# Patient Record
Sex: Male | Born: 1935 | Race: Black or African American | Hispanic: No | Marital: Married | Smoking: Former smoker
Health system: Southern US, Community
[De-identification: ages and names within clinical notes are randomized; demographics above are authoritative.]

## PROBLEM LIST (undated history)

## (undated) DIAGNOSIS — M109 Gout, unspecified: Secondary | ICD-10-CM

## (undated) DIAGNOSIS — N289 Disorder of kidney and ureter, unspecified: Secondary | ICD-10-CM

## (undated) DIAGNOSIS — D691 Qualitative platelet defects: Secondary | ICD-10-CM

## (undated) DIAGNOSIS — I10 Essential (primary) hypertension: Secondary | ICD-10-CM

## (undated) DIAGNOSIS — Z800 Family history of malignant neoplasm of digestive organs: Secondary | ICD-10-CM

## (undated) DIAGNOSIS — F0390 Unspecified dementia without behavioral disturbance: Secondary | ICD-10-CM

## (undated) DIAGNOSIS — E785 Hyperlipidemia, unspecified: Secondary | ICD-10-CM

## (undated) HISTORY — DX: Disorder of kidney and ureter, unspecified: N28.9

## (undated) HISTORY — DX: Essential (primary) hypertension: I10

## (undated) HISTORY — DX: Gout, unspecified: M10.9

## (undated) HISTORY — DX: Family history of malignant neoplasm of digestive organs: Z80.0

## (undated) HISTORY — DX: Qualitative platelet defects: D69.1

## (undated) HISTORY — DX: Hyperlipidemia, unspecified: E78.5

## (undated) HISTORY — PX: CATARACT EXTRACTION: SUR2

## (undated) HISTORY — DX: Unspecified dementia, unspecified severity, without behavioral disturbance, psychotic disturbance, mood disturbance, and anxiety: F03.90

---

## 1999-09-05 ENCOUNTER — Encounter: Payer: Self-pay | Admitting: Emergency Medicine

## 1999-09-05 ENCOUNTER — Emergency Department (HOSPITAL_COMMUNITY): Admission: EM | Admit: 1999-09-05 | Discharge: 1999-09-05 | Payer: Self-pay | Admitting: Emergency Medicine

## 2000-03-13 ENCOUNTER — Emergency Department (HOSPITAL_COMMUNITY): Admission: EM | Admit: 2000-03-13 | Discharge: 2000-03-13 | Payer: Self-pay | Admitting: Emergency Medicine

## 2002-12-20 ENCOUNTER — Encounter: Admission: RE | Admit: 2002-12-20 | Discharge: 2002-12-20 | Payer: Self-pay | Admitting: Family Medicine

## 2004-04-11 ENCOUNTER — Ambulatory Visit: Payer: Self-pay | Admitting: Gastroenterology

## 2004-05-02 ENCOUNTER — Ambulatory Visit: Payer: Self-pay | Admitting: Gastroenterology

## 2006-06-17 ENCOUNTER — Encounter: Admission: RE | Admit: 2006-06-17 | Discharge: 2006-06-17 | Payer: Self-pay | Admitting: Family Medicine

## 2006-06-29 ENCOUNTER — Ambulatory Visit (HOSPITAL_COMMUNITY): Admission: RE | Admit: 2006-06-29 | Discharge: 2006-06-29 | Payer: Self-pay | Admitting: Family Medicine

## 2009-04-12 ENCOUNTER — Encounter (INDEPENDENT_AMBULATORY_CARE_PROVIDER_SITE_OTHER): Payer: Self-pay | Admitting: *Deleted

## 2009-12-04 ENCOUNTER — Telehealth: Payer: Self-pay | Admitting: Gastroenterology

## 2010-03-26 NOTE — Progress Notes (Signed)
Summary: Schedule Office Visit to Discuss Colonoscopy  Phone Note Outgoing Call Call back at [facility] Phone [phone]   Call placed by: Harlow Mares CMA Duncan Dull),  December 04, 2009 4:16 PM Call placed to: Patient Summary of Call: called patient to advise him that he is due for an office visit to discuss colonoscopy but someone at ths number listed above hung up when I asked for the patient. Initial call taken by: Harlow Mares CMA Duncan Dull),  December 04, 2009 4:17 PM

## 2010-03-26 NOTE — Letter (Signed)
Summary: Colonoscopy-Changed to Office Visit Letter  [facility]  [address] [place], [place] [address]   Phone: [phone]  Fax: [phone]      April 12, 2009 MRN: [id number]   Mathew Waller [address] [place], [place]  [address]   Dear Mr. LUU,   According to our records, it is time for you to schedule a Colonoscopy. However, after reviewing your medical record, I feel that an office visit would be most appropriate to more completely evaluate you and determine your need for a repeat procedure.  Please call [phone] (option #2) at your convenience to schedule an office visit. If you have any questions, concerns, or feel that this letter is in error, we would appreciate your call.   Sincerely,  Barbette Hair. Arlyce Dice, M.D.  [facility] [phone]

## 2011-05-26 ENCOUNTER — Encounter: Payer: Self-pay | Admitting: Gastroenterology

## 2011-11-06 ENCOUNTER — Telehealth: Payer: Self-pay | Admitting: Oncology

## 2011-11-06 NOTE — Telephone Encounter (Signed)
S/W pt in re NP appt 10/8 @ 2:30 w/Dr. Arline Asp Referring Dr. Angelita Ingles. [place] Dx- Thrombocytosis NP packet mailed.

## 2011-11-07 ENCOUNTER — Telehealth: Payer: Self-pay | Admitting: Oncology

## 2011-11-07 NOTE — Telephone Encounter (Signed)
C/D on 9/13 for appt 10/08

## 2011-11-18 ENCOUNTER — Other Ambulatory Visit: Payer: Self-pay | Admitting: Diagnostic Neuroimaging

## 2011-11-18 DIAGNOSIS — R413 Other amnesia: Secondary | ICD-10-CM

## 2011-11-19 ENCOUNTER — Telehealth: Payer: Self-pay | Admitting: Gastroenterology

## 2011-11-19 ENCOUNTER — Encounter: Payer: Self-pay | Admitting: Gastroenterology

## 2011-11-19 NOTE — Telephone Encounter (Signed)
Offered an appt with [facility] for this week but [name] requested Dr. Arlyce Dice. Pts appt rescheduled to 12/10/11 @10 :15am. [name] to fax records and notify pt of appt date and time.

## 2011-11-25 ENCOUNTER — Inpatient Hospital Stay: Admission: RE | Admit: 2011-11-25 | Payer: Self-pay | Source: Ambulatory Visit

## 2011-12-01 ENCOUNTER — Other Ambulatory Visit: Payer: Self-pay | Admitting: Oncology

## 2011-12-01 DIAGNOSIS — D75839 Thrombocytosis, unspecified: Secondary | ICD-10-CM | POA: Insufficient documentation

## 2011-12-01 DIAGNOSIS — D473 Essential (hemorrhagic) thrombocythemia: Secondary | ICD-10-CM

## 2011-12-02 ENCOUNTER — Ambulatory Visit: Payer: Self-pay | Admitting: Oncology

## 2011-12-02 ENCOUNTER — Other Ambulatory Visit: Payer: Self-pay | Admitting: Lab

## 2011-12-02 ENCOUNTER — Ambulatory Visit: Payer: Self-pay

## 2011-12-05 ENCOUNTER — Ambulatory Visit
Admission: RE | Admit: 2011-12-05 | Discharge: 2011-12-05 | Disposition: A | Discharge status: Home / Self Care | Payer: Medicare Other | Source: Ambulatory Visit | Attending: Diagnostic Neuroimaging | Admitting: Diagnostic Neuroimaging

## 2011-12-05 DIAGNOSIS — R413 Other amnesia: Secondary | ICD-10-CM

## 2011-12-10 ENCOUNTER — Ambulatory Visit: Payer: Self-pay | Admitting: Gastroenterology

## 2011-12-15 ENCOUNTER — Other Ambulatory Visit: Payer: Self-pay | Admitting: Oncology

## 2011-12-16 ENCOUNTER — Ambulatory Visit: Payer: Medicare Other

## 2011-12-16 ENCOUNTER — Other Ambulatory Visit (HOSPITAL_BASED_OUTPATIENT_CLINIC_OR_DEPARTMENT_OTHER): Payer: Medicare Other | Admitting: Lab

## 2011-12-16 ENCOUNTER — Telehealth: Payer: Self-pay | Admitting: Oncology

## 2011-12-16 ENCOUNTER — Encounter: Payer: Self-pay | Admitting: Oncology

## 2011-12-16 ENCOUNTER — Ambulatory Visit (HOSPITAL_BASED_OUTPATIENT_CLINIC_OR_DEPARTMENT_OTHER): Payer: Medicare Other | Admitting: Oncology

## 2011-12-16 VITALS — BP 194/73 | HR 56 | Temp 97.9°F | Resp 18 | Ht 68.0 in | Wt 147.8 lb

## 2011-12-16 DIAGNOSIS — D75839 Thrombocytosis, unspecified: Secondary | ICD-10-CM

## 2011-12-16 DIAGNOSIS — D473 Essential (hemorrhagic) thrombocythemia: Secondary | ICD-10-CM

## 2011-12-16 LAB — CHCC SMEAR

## 2011-12-16 LAB — CBC WITH DIFFERENTIAL/PLATELET
Basophils Absolute: 0 10*3/uL (ref 0.0–0.1)
Eosinophils Absolute: 0.1 10*3/uL (ref 0.0–0.5)
HGB: 12.4 g/dL — ABNORMAL LOW (ref 13.0–17.1)
MONO#: 0.6 10*3/uL (ref 0.1–0.9)
NEUT#: 3.3 10*3/uL (ref 1.5–6.5)
RBC: 4.6 10*6/uL (ref 4.20–5.82)
RDW: 14.5 % (ref 11.0–14.6)
WBC: 6.9 10*3/uL (ref 4.0–10.3)
lymph#: 2.9 10*3/uL (ref 0.9–3.3)

## 2011-12-16 LAB — MORPHOLOGY

## 2011-12-16 LAB — COMPREHENSIVE METABOLIC PANEL (CC13)
ALT: 15 U/L (ref 0–55)
Albumin: 3.7 g/dL (ref 3.5–5.0)
CO2: 27 mEq/L (ref 22–29)
Glucose: 100 mg/dl — ABNORMAL HIGH (ref 70–99)
Potassium: 4.3 mEq/L (ref 3.5–5.1)
Sodium: 141 mEq/L (ref 136–145)
Total Protein: 7.5 g/dL (ref 6.4–8.3)

## 2011-12-16 LAB — LACTATE DEHYDROGENASE (CC13): LDH: 172 U/L (ref 125–220)

## 2011-12-16 NOTE — Patient Instructions (Signed)
Your platelet count today was normal--333,000.  We will check your blood counts in 2 months and again in 4 months.  If the platelet count is abnormal, we will schedule you for a doctor's appointment, otherwise you don't need to see [place] again.

## 2011-12-16 NOTE — Progress Notes (Signed)
Office new patient consult note dictated.  #[id number]

## 2011-12-16 NOTE — Telephone Encounter (Signed)
gv relative appt schedule for December 2013 and February 2014. Per 10/22 pof lb 2 and 4 mos - no f/u appt.

## 2011-12-16 NOTE — Progress Notes (Signed)
Checked in new pt with no financial concerns. °

## 2011-12-17 NOTE — Progress Notes (Signed)
CC:   Clyda Greener, MD  PROBLEM LIST: 1. History of thrombocytosis with a platelet count of 666,000 on     11/04/2011.  Repeat platelet count on 11/16/2011 was 333,000. 2. Chronic renal disease, stage 3. 3. Hypertension. 4. Dyslipidemia. 5. Suspected early dementia. 6. History of weight loss, possibly secondary to poor dentition.  The     patient apparently weighed 175 pounds back in 2012. 7. Poor dentition. 8. History of gout. 9. Bilateral cataract surgery. 10.Family history of colon cancer.  A son died of metastatic colon     cancer at age 12 and a daughter died at age 56.  MEDICATIONS: 1. Norvasc 10 mg daily. 2. Aspirin 325 mg daily. 3. Atenolol 50 mg daily. 4. Lipitor 20 mg daily. 5. Aricept 5 mg daily. 6. Lasix 20 mg daily. 7. Toprol-XL 50 mg daily.  SMOKING HISTORY:  The patient smoked a few cigarettes a day for about 30 years starting at age 85.  He stopped smoking in the early 1980s.  REASON FOR CONSULTATION:  Thrombocytosis.  HISTORY:  Mr. Javarri Densford is an 78 year old married black male who was seen today on referral from Dr. Clyda Greener for evaluation of thrombocytosis.  A platelet count on 11/04/2011 was 666,000.  The rest of the differential was normal.  We have a platelet count from September 12, 2010, that was 403,000.  The patient is accompanied by his daughter Victorino Dike.  The patient lives with several of his children, specifically his daughter Victorino Dike and 2 sons and possibly some great-grandchildren. The patient is without any complaints today.  He has been in unusually good health.  He states that he has never been hospitalized or had any surgery other then bilateral cataract surgery.  He does have some medical problems as noted above currently under treatment and fairly good control.  He does complain of some arthritis in his feet and history of some weight loss.  Apparently, he has lost approximately 25- 30 pounds over the past year or so, primarily because  of difficulty eating and some pain eating with extremely poor dentition.  He is without complaints today, says he feels fine.  Apparently, the finding of an elevated platelet count was unexpected back a few weeks ago.  PAST MEDICAL HISTORY:  Can be found above.  The patient may have some early dementia.  FAMILY HISTORY:  Notable for colon cancer with a son dying of metastatic colon cancer at age 55 and a daughter dying at age 70.  Other members of the family have had polyps.  The patient apparently has never had a colonoscopy.  Apparently, he is due to see Dr. Melvia Heaps on Thursday for evaluation of his weight loss and possible colonoscopy, although the patient denies any blood in his stools.  There is a family history of diabetes mellitus and hypertension.  SOCIAL HISTORY:  The patient has smoked a few cigarettes a day for 30 years.  He started at age 38.  He stopped smoking in the early 1980s. Apparently, he has been a heavy user of alcohol in the form of hard liquor, although he is not drinking at the present time according to his daughter.  He has a 6th grade education.  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  He is retired, although he cannot tell me for how long.  He does drive, but was advised not to  He drives short distances.  The patient is not a very good historian.  He lives with his wife and children and grandchildren.  REVIEW OF SYSTEMS:  The patient denies any sense of ill health.  He denies ever having a stroke or loss of consciousness.  Vision is good. He has some decrease in hearing.  No history of hay fever or allergies. He denies any respiratory or cardiac problems.  He denies any difficulty with eating, change of bowel habit, blood in the stools, any history of liver problems, jaundice, hepatitis.  He has nocturia 3-4 times which is a recent symptom.  He denies any swelling of the legs,  history of blood clots.  No bleeding or bruising problems.  He has arthritis in his feet. He denies any back pain.  No history of fever, infections, night sweats. He denies any history of skin problems or rashes and denies any history of depression.  PHYSICAL EXAM:  General:  Mr. Roye looks fairly well developed and well nourished.  He does not look cachectic or ill.  His weight today is 147.8 pounds, height 5 feet 8 inches, body surface area 1.79 sq m. Vital Signs:  Blood pressure today was 194/73.  The patient and his daughter Victorino Dike were made aware of his elevated blood pressure.  Other vital signs are normal.  Pulse was regular at 56.  HEENT:  He has male pattern balding.  He has had cataract surgery.  Extraocular movements are normal.  There is conjunctival injection and muddy sclerae. Dentition is quite poor.  He has some torus nodules involving the floor of his mouth.  These look benign.  Neck:  Without adenopathy, thyroid enlargement, or bruit.  No axillary or inguinal adenopathy.  Heart and Lungs:  Normal.  Abdomen:  Benign, soft, nontender with no organomegaly or masses palpable.  A very soft liver edge could be felt descending below the right costal margin with inspiration.  Liver was soft and nontender.  Extremities:  No peripheral edema or clubbing, palmar erythema, petechiae, or purpura.  Neurologic:  Grossly normal.  LABORATORY DATA:  Today, white count 6.9, ANC 3.3, hemoglobin 12.4, hematocrit 38.4, platelets 333,000.  Reds cell indices were normal. Differential was normal.  Inspection of the peripheral smear was unremarkable.  Chemistries were notable for a BUN of 20.0, creatinine 1.4, otherwise normal.  Albumin was 3.7, LDH 172, and glucose was 100.  IMPRESSION AND PLAN:  Today's platelet count is normal at 333,000 with an unremarkable differential.  I cannot explain the elevated platelet count from 11/04/2011 other than this may have been a lab error or some sort  of an acute secondary response that was transient.  In any event, I am doubtful that there is any real hematologic pathology here.  We will plan to check CBC again in 2 months and again in 4 months.  I have not made a return appointment for Mr. Skoda, but would be happy to see him again if any questions arise or certainly if future CBCs come back abnormal.  It is my understanding that Mr. Poth will be seeing Dr. Arlyce Dice in the next couple of days, later this week.  Although the family history for colon cancer is quite striking, I have some reservations about recommending colonoscopy for Mr. Otero in the absence of any findings that would point toward a colon cancer, especially as his age of 70.   ______________________________ Samul Dada, M.D. DSM/MEDQ  D:  12/16/2011  T:  12/17/2011  Job:  [id number]

## 2011-12-19 ENCOUNTER — Ambulatory Visit: Payer: Self-pay | Admitting: Gastroenterology

## 2012-01-15 ENCOUNTER — Other Ambulatory Visit: Payer: Self-pay | Admitting: *Deleted

## 2012-01-20 ENCOUNTER — Ambulatory Visit (INDEPENDENT_AMBULATORY_CARE_PROVIDER_SITE_OTHER): Payer: Medicare Other | Admitting: Gastroenterology

## 2012-01-20 ENCOUNTER — Encounter: Payer: Self-pay | Admitting: Gastroenterology

## 2012-01-20 VITALS — BP 168/70 | HR 58 | Ht 68.0 in | Wt 148.0 lb

## 2012-01-20 DIAGNOSIS — R634 Abnormal weight loss: Secondary | ICD-10-CM

## 2012-01-20 NOTE — Progress Notes (Signed)
History of Present Illness: 78 year old African American male referred at the request of Dr. Bruna Potter for evaluation of weight loss. Over the past 8-10 months he's lost about 8 pounds. His main complaint is feeding difficulties 2 to poor dentition. He has been advised to have his teeth replaced but he has so far resisted. Appetite is good. He denies early satiety, abdominal pain or melena his. Last colonoscopy in 2006 was negative for polyps. Family history is pertinent for 2 children who had colon cancer.    Past Medical History  Diagnosis Date  . Thrombocyte disorder   . Renal disease     stage 3  . Unspecified essential hypertension   . Dyslipidemia   . Dementia   . Gout   . Family history of colon cancer     son deseased with colon cancer   Past Surgical History  Procedure Date  . Cataract extraction    family history includes Colon cancer in his daughter and son; Diabetes in an unspecified family member; and Hypertension in an unspecified family member. Current Outpatient Prescriptions  Medication Sig Dispense Refill  . amLODipine (NORVASC) 10 MG tablet Take 10 mg by mouth daily.      Marland Kitchen aspirin 325 MG tablet Take 325 mg by mouth daily.      Marland Kitchen atenolol (TENORMIN) 50 MG tablet Take 50 mg by mouth daily.      Marland Kitchen atorvastatin (LIPITOR) 20 MG tablet Take 1 tablet by mouth Daily.      Marland Kitchen donepezil (ARICEPT) 5 MG tablet Take 1 tablet by mouth Daily.      . furosemide (LASIX) 20 MG tablet Take 20 mg by mouth daily.      Marland Kitchen lisinopril (PRINIVIL,ZESTRIL) 20 MG tablet Take 20 mg by mouth daily.      . metoprolol succinate (TOPROL-XL) 50 MG 24 hr tablet Take 1 tablet by mouth Daily.       Allergies as of 01/20/2012  . (No Known Allergies)    reports that he quit smoking about 35 years ago. His smoking use included Cigarettes. He has a 9.5 pack-year smoking history. He has never used smokeless tobacco. He reports that he does not drink alcohol or use illicit drugs.     Review of Systems:  Pertinent positive and negative review of systems were noted in the above HPI section. All other review of systems were otherwise negative.  Vital signs were reviewed in today's medical record Physical Exam: General: Well developed , well nourished, no acute distress Head: Normocephalic and atraumatic Eyes:  sclerae anicteric, EOMI Ears: Normal auditory acuity Mouth: No deformity or lesions Neck: Supple, no masses or thyromegaly Lungs: Clear throughout to auscultation Heart: Regular rate and rhythm; no murmurs, rubs or bruits Abdomen: Soft, non tender and non distended. No masses, hepatosplenomegaly or hernias noted. Normal Bowel sounds Rectal:deferred Musculoskeletal: Symmetrical with no gross deformities  Skin: No lesions on visible extremities Pulses:  Normal pulses noted Extremities: No clubbing, cyanosis, edema or deformities noted Neurological: Alert oriented x 4, grossly nonfocal Cervical Nodes:  No significant cervical adenopathy Inguinal Nodes: No significant inguinal adenopathy Psychological:  Alert and cooperative. Normal mood and affect

## 2012-01-20 NOTE — Assessment & Plan Note (Addendum)
According to the patient and his daughter weight loss was due to  eating difficulties secondary to poor dentition. Weight has stabilized and he altogether is feeling well.  Recent lab work was unremarkable.  Recommendations #1 hold further workup provided that weight continues to increase or  stabilizes. Patient was instructed to contact me if he suffers further weight loss or if  any specific concerns arise.

## 2012-01-20 NOTE — Patient Instructions (Addendum)
Follow up as needed

## 2012-02-12 ENCOUNTER — Other Ambulatory Visit (HOSPITAL_BASED_OUTPATIENT_CLINIC_OR_DEPARTMENT_OTHER): Payer: Medicare Other | Admitting: Lab

## 2012-02-12 DIAGNOSIS — D473 Essential (hemorrhagic) thrombocythemia: Secondary | ICD-10-CM

## 2012-02-12 DIAGNOSIS — D75839 Thrombocytosis, unspecified: Secondary | ICD-10-CM

## 2012-02-12 LAB — CBC WITH DIFFERENTIAL/PLATELET
Basophils Absolute: 0.1 10*3/uL (ref 0.0–0.1)
Eosinophils Absolute: 0.1 10*3/uL (ref 0.0–0.5)
HGB: 11.4 g/dL — ABNORMAL LOW (ref 13.0–17.1)
MCV: 84.3 fL (ref 79.3–98.0)
MONO#: 0.6 10*3/uL (ref 0.1–0.9)
MONO%: 6.7 % (ref 0.0–14.0)
NEUT#: 4.4 10*3/uL (ref 1.5–6.5)
Platelets: 308 10*3/uL (ref 140–400)
RBC: 4.16 10*6/uL — ABNORMAL LOW (ref 4.20–5.82)
RDW: 15.7 % — ABNORMAL HIGH (ref 11.0–14.6)
WBC: 8.6 10*3/uL (ref 4.0–10.3)

## 2012-04-15 ENCOUNTER — Other Ambulatory Visit: Payer: Medicare Other | Admitting: Lab

## 2012-04-15 ENCOUNTER — Telehealth: Payer: Self-pay | Admitting: Oncology

## 2012-04-15 NOTE — Telephone Encounter (Signed)
pt called to r/s lab....done °

## 2012-04-16 ENCOUNTER — Other Ambulatory Visit: Payer: Self-pay | Admitting: Oncology

## 2012-04-16 ENCOUNTER — Other Ambulatory Visit (HOSPITAL_BASED_OUTPATIENT_CLINIC_OR_DEPARTMENT_OTHER): Payer: Medicare Other

## 2012-04-16 ENCOUNTER — Encounter: Payer: Self-pay | Admitting: Oncology

## 2012-04-16 DIAGNOSIS — D473 Essential (hemorrhagic) thrombocythemia: Secondary | ICD-10-CM

## 2012-04-16 DIAGNOSIS — D75839 Thrombocytosis, unspecified: Secondary | ICD-10-CM

## 2012-04-16 LAB — CBC WITH DIFFERENTIAL/PLATELET
Basophils Absolute: 0.1 10*3/uL (ref 0.0–0.1)
Eosinophils Absolute: 0.2 10*3/uL (ref 0.0–0.5)
HCT: 35 % — ABNORMAL LOW (ref 38.4–49.9)
LYMPH%: 37.1 % (ref 14.0–49.0)
MCV: 84.1 fL (ref 79.3–98.0)
MONO#: 0.6 10*3/uL (ref 0.1–0.9)
MONO%: 6.8 % (ref 0.0–14.0)
NEUT#: 4.6 10*3/uL (ref 1.5–6.5)
NEUT%: 53.3 % (ref 39.0–75.0)
Platelets: 571 10*3/uL — ABNORMAL HIGH (ref 140–400)
WBC: 8.7 10*3/uL (ref 4.0–10.3)

## 2012-04-16 NOTE — Progress Notes (Unsigned)
This patient had been seen by me on 12/16/2011. He was referred because of a history of thrombocytosis. We did not schedule him for a return appointment. We've been checking his CBC every 2 months.  Platelet count on 12/16/2011 was 333,000. On 02/12/2012, platelet count was 308,000. Today, 04/16/2012, platelet count is 571,000.  We will check another CBC in 1 month and also check for the JAK2 mutation at that time.

## 2012-04-18 ENCOUNTER — Telehealth: Payer: Self-pay | Admitting: Oncology

## 2012-04-18 NOTE — Telephone Encounter (Signed)
S/w pt re appt for 3/21.  °

## 2012-04-19 ENCOUNTER — Other Ambulatory Visit: Payer: Self-pay | Admitting: Medical Oncology

## 2012-05-14 ENCOUNTER — Other Ambulatory Visit: Payer: Medicare Other

## 2015-02-12 DIAGNOSIS — N183 Chronic kidney disease, stage 3 (moderate): Secondary | ICD-10-CM | POA: Diagnosis not present

## 2015-02-12 DIAGNOSIS — I10 Essential (primary) hypertension: Secondary | ICD-10-CM | POA: Diagnosis not present

## 2015-05-10 DIAGNOSIS — Z23 Encounter for immunization: Secondary | ICD-10-CM | POA: Diagnosis not present

## 2015-05-10 DIAGNOSIS — H6121 Impacted cerumen, right ear: Secondary | ICD-10-CM | POA: Diagnosis not present

## 2015-05-10 DIAGNOSIS — I10 Essential (primary) hypertension: Secondary | ICD-10-CM | POA: Diagnosis not present

## 2015-05-10 DIAGNOSIS — H9190 Unspecified hearing loss, unspecified ear: Secondary | ICD-10-CM | POA: Diagnosis not present

## 2015-05-16 DIAGNOSIS — N183 Chronic kidney disease, stage 3 (moderate): Secondary | ICD-10-CM | POA: Diagnosis not present

## 2015-05-16 DIAGNOSIS — I10 Essential (primary) hypertension: Secondary | ICD-10-CM | POA: Diagnosis not present

## 2016-03-10 DIAGNOSIS — I10 Essential (primary) hypertension: Secondary | ICD-10-CM | POA: Diagnosis not present

## 2016-06-09 DIAGNOSIS — Z23 Encounter for immunization: Secondary | ICD-10-CM | POA: Diagnosis not present

## 2016-06-09 DIAGNOSIS — I10 Essential (primary) hypertension: Secondary | ICD-10-CM | POA: Diagnosis not present

## 2016-12-10 DIAGNOSIS — Z23 Encounter for immunization: Secondary | ICD-10-CM | POA: Diagnosis not present

## 2016-12-10 DIAGNOSIS — I10 Essential (primary) hypertension: Secondary | ICD-10-CM | POA: Diagnosis not present

## 2017-07-25 ENCOUNTER — Inpatient Hospital Stay (HOSPITAL_COMMUNITY): Payer: Medicare HMO

## 2017-07-25 ENCOUNTER — Emergency Department (HOSPITAL_COMMUNITY): Payer: Medicare HMO

## 2017-07-25 ENCOUNTER — Inpatient Hospital Stay (HOSPITAL_COMMUNITY)
Admission: EM | Admit: 2017-07-25 | Discharge: 2017-07-29 | DRG: 444 | Disposition: A | Discharge status: Other Acute Inpatient Hospital | Payer: Medicare HMO | Attending: Internal Medicine | Admitting: Internal Medicine

## 2017-07-25 ENCOUNTER — Other Ambulatory Visit: Payer: Self-pay

## 2017-07-25 ENCOUNTER — Encounter (HOSPITAL_COMMUNITY): Payer: Self-pay | Admitting: Internal Medicine

## 2017-07-25 DIAGNOSIS — I160 Hypertensive urgency: Secondary | ICD-10-CM | POA: Diagnosis present

## 2017-07-25 DIAGNOSIS — R740 Nonspecific elevation of levels of transaminase and lactic acid dehydrogenase [LDH]: Secondary | ICD-10-CM | POA: Diagnosis not present

## 2017-07-25 DIAGNOSIS — Z87891 Personal history of nicotine dependence: Secondary | ICD-10-CM

## 2017-07-25 DIAGNOSIS — K8020 Calculus of gallbladder without cholecystitis without obstruction: Secondary | ICD-10-CM | POA: Diagnosis present

## 2017-07-25 DIAGNOSIS — N3289 Other specified disorders of bladder: Secondary | ICD-10-CM | POA: Diagnosis present

## 2017-07-25 DIAGNOSIS — K315 Obstruction of duodenum: Secondary | ICD-10-CM | POA: Diagnosis present

## 2017-07-25 DIAGNOSIS — Z91128 Patient's intentional underdosing of medication regimen for other reason: Secondary | ICD-10-CM

## 2017-07-25 DIAGNOSIS — E876 Hypokalemia: Secondary | ICD-10-CM | POA: Diagnosis not present

## 2017-07-25 DIAGNOSIS — N183 Chronic kidney disease, stage 3 (moderate): Secondary | ICD-10-CM | POA: Diagnosis present

## 2017-07-25 DIAGNOSIS — R945 Abnormal results of liver function studies: Secondary | ICD-10-CM | POA: Diagnosis not present

## 2017-07-25 DIAGNOSIS — N179 Acute kidney failure, unspecified: Secondary | ICD-10-CM | POA: Diagnosis present

## 2017-07-25 DIAGNOSIS — R112 Nausea with vomiting, unspecified: Secondary | ICD-10-CM | POA: Diagnosis not present

## 2017-07-25 DIAGNOSIS — M79641 Pain in right hand: Secondary | ICD-10-CM | POA: Diagnosis present

## 2017-07-25 DIAGNOSIS — E870 Hyperosmolality and hypernatremia: Secondary | ICD-10-CM | POA: Diagnosis present

## 2017-07-25 DIAGNOSIS — Z86010 Personal history of colonic polyps: Secondary | ICD-10-CM | POA: Diagnosis not present

## 2017-07-25 DIAGNOSIS — Z800 Family history of malignant neoplasm of digestive organs: Secondary | ICD-10-CM

## 2017-07-25 DIAGNOSIS — T50996A Underdosing of other drugs, medicaments and biological substances, initial encounter: Secondary | ICD-10-CM | POA: Diagnosis present

## 2017-07-25 DIAGNOSIS — Z681 Body mass index (BMI) 19 or less, adult: Secondary | ICD-10-CM

## 2017-07-25 DIAGNOSIS — D72829 Elevated white blood cell count, unspecified: Secondary | ICD-10-CM | POA: Diagnosis not present

## 2017-07-25 DIAGNOSIS — R64 Cachexia: Secondary | ICD-10-CM | POA: Diagnosis present

## 2017-07-25 DIAGNOSIS — E872 Acidosis: Secondary | ICD-10-CM | POA: Diagnosis present

## 2017-07-25 DIAGNOSIS — K8070 Calculus of gallbladder and bile duct without cholecystitis without obstruction: Principal | ICD-10-CM | POA: Diagnosis present

## 2017-07-25 DIAGNOSIS — R7401 Elevation of levels of liver transaminase levels: Secondary | ICD-10-CM | POA: Insufficient documentation

## 2017-07-25 DIAGNOSIS — I10 Essential (primary) hypertension: Secondary | ICD-10-CM

## 2017-07-25 DIAGNOSIS — D473 Essential (hemorrhagic) thrombocythemia: Secondary | ICD-10-CM | POA: Diagnosis present

## 2017-07-25 DIAGNOSIS — R7989 Other specified abnormal findings of blood chemistry: Secondary | ICD-10-CM

## 2017-07-25 DIAGNOSIS — K838 Other specified diseases of biliary tract: Secondary | ICD-10-CM | POA: Diagnosis not present

## 2017-07-25 DIAGNOSIS — R748 Abnormal levels of other serum enzymes: Secondary | ICD-10-CM | POA: Diagnosis present

## 2017-07-25 DIAGNOSIS — J209 Acute bronchitis, unspecified: Secondary | ICD-10-CM | POA: Diagnosis present

## 2017-07-25 DIAGNOSIS — K210 Gastro-esophageal reflux disease with esophagitis: Secondary | ICD-10-CM | POA: Diagnosis present

## 2017-07-25 DIAGNOSIS — E43 Unspecified severe protein-calorie malnutrition: Secondary | ICD-10-CM

## 2017-07-25 DIAGNOSIS — Z8719 Personal history of other diseases of the digestive system: Secondary | ICD-10-CM

## 2017-07-25 DIAGNOSIS — R1310 Dysphagia, unspecified: Secondary | ICD-10-CM | POA: Diagnosis present

## 2017-07-25 DIAGNOSIS — F0390 Unspecified dementia without behavioral disturbance: Secondary | ICD-10-CM | POA: Diagnosis present

## 2017-07-25 DIAGNOSIS — K8071 Calculus of gallbladder and bile duct without cholecystitis with obstruction: Secondary | ICD-10-CM | POA: Diagnosis not present

## 2017-07-25 DIAGNOSIS — Z972 Presence of dental prosthetic device (complete) (partial): Secondary | ICD-10-CM

## 2017-07-25 DIAGNOSIS — R1110 Vomiting, unspecified: Secondary | ICD-10-CM | POA: Diagnosis not present

## 2017-07-25 DIAGNOSIS — Z79899 Other long term (current) drug therapy: Secondary | ICD-10-CM

## 2017-07-25 DIAGNOSIS — I129 Hypertensive chronic kidney disease with stage 1 through stage 4 chronic kidney disease, or unspecified chronic kidney disease: Secondary | ICD-10-CM | POA: Diagnosis present

## 2017-07-25 DIAGNOSIS — R338 Other retention of urine: Secondary | ICD-10-CM | POA: Diagnosis present

## 2017-07-25 LAB — CBC WITH DIFFERENTIAL/PLATELET
Basophils Absolute: 0 10*3/uL (ref 0.0–0.1)
Basophils Relative: 0 %
EOS ABS: 0 10*3/uL (ref 0.0–0.7)
EOS PCT: 0 %
HCT: 43 % (ref 39.0–52.0)
HEMOGLOBIN: 14.2 g/dL (ref 13.0–17.0)
LYMPHS ABS: 1 10*3/uL (ref 0.7–4.0)
Lymphocytes Relative: 8 %
MCH: 28.2 pg (ref 26.0–34.0)
MCHC: 33 g/dL (ref 30.0–36.0)
MCV: 85.3 fL (ref 78.0–100.0)
Monocytes Absolute: 1.2 10*3/uL — ABNORMAL HIGH (ref 0.1–1.0)
Monocytes Relative: 10 %
NEUTROS PCT: 82 %
Neutro Abs: 10.7 10*3/uL — ABNORMAL HIGH (ref 1.7–7.7)
Platelets: 854 10*3/uL — ABNORMAL HIGH (ref 150–400)
RBC: 5.04 MIL/uL (ref 4.22–5.81)
RDW: 13.5 % (ref 11.5–15.5)
WBC: 13 10*3/uL — ABNORMAL HIGH (ref 4.0–10.5)

## 2017-07-25 LAB — COMPREHENSIVE METABOLIC PANEL
ALBUMIN: 2.9 g/dL — AB (ref 3.5–5.0)
ALT: 152 U/L — AB (ref 17–63)
AST: 142 U/L — AB (ref 15–41)
Alkaline Phosphatase: 288 U/L — ABNORMAL HIGH (ref 38–126)
Anion gap: 13 (ref 5–15)
BUN: 56 mg/dL — ABNORMAL HIGH (ref 6–20)
CHLORIDE: 107 mmol/L (ref 101–111)
CO2: 26 mmol/L (ref 22–32)
CREATININE: 2.52 mg/dL — AB (ref 0.61–1.24)
Calcium: 10.1 mg/dL (ref 8.9–10.3)
GFR calc Af Amer: 24 mL/min — ABNORMAL LOW (ref >60)
GFR calc non Af Amer: 20 mL/min — ABNORMAL LOW (ref >60)
GLUCOSE: 126 mg/dL — AB (ref 65–99)
Potassium: 4.8 mmol/L (ref 3.5–5.1)
Sodium: 146 mmol/L — ABNORMAL HIGH (ref 135–145)
Total Bilirubin: 3.2 mg/dL — ABNORMAL HIGH (ref 0.3–1.2)
Total Protein: 7.8 g/dL (ref 6.5–8.1)

## 2017-07-25 LAB — URINALYSIS, ROUTINE W REFLEX MICROSCOPIC
Glucose, UA: NEGATIVE mg/dL
Ketones, ur: 5 mg/dL — AB
Leukocytes, UA: NEGATIVE
Nitrite: POSITIVE — AB
PROTEIN: 100 mg/dL — AB
SPECIFIC GRAVITY, URINE: 1.021 (ref 1.005–1.030)
pH: 5 (ref 5.0–8.0)

## 2017-07-25 MED ORDER — ONDANSETRON HCL 4 MG/2ML IJ SOLN
4.0000 mg | Freq: Four times a day (QID) | INTRAMUSCULAR | Status: DC | PRN
  Administered 2017-07-25 – 2017-07-26 (×3): 4 mg via INTRAVENOUS
  Filled 2017-07-25 (×3): qty 2

## 2017-07-25 MED ORDER — SODIUM CHLORIDE 0.9 % IV SOLN
INTRAVENOUS | Status: AC
  Administered 2017-07-25: 23:00:00 via INTRAVENOUS

## 2017-07-25 MED ORDER — ONDANSETRON HCL 4 MG PO TABS: 4.0000 mg | ORAL_TABLET | Freq: Four times a day (QID) | ORAL | Status: DC | PRN

## 2017-07-25 MED ORDER — SODIUM CHLORIDE 0.9 % IV SOLN
1.0000 g | INTRAVENOUS | Status: DC
  Filled 2017-07-25: qty 10

## 2017-07-25 MED ORDER — METOPROLOL TARTRATE 5 MG/5ML IV SOLN
5.0000 mg | Freq: Three times a day (TID) | INTRAVENOUS | Status: DC
  Administered 2017-07-25 – 2017-07-26 (×2): 5 mg via INTRAVENOUS
  Filled 2017-07-25 (×2): qty 5

## 2017-07-25 MED ORDER — ACETAMINOPHEN 650 MG RE SUPP: 650.0000 mg | Freq: Four times a day (QID) | RECTAL | Status: DC | PRN

## 2017-07-25 MED ORDER — HYDRALAZINE HCL 20 MG/ML IJ SOLN
10.0000 mg | INTRAMUSCULAR | Status: DC | PRN
  Administered 2017-07-26 (×2): 10 mg via INTRAVENOUS
  Filled 2017-07-25 (×2): qty 1

## 2017-07-25 MED ORDER — PIPERACILLIN-TAZOBACTAM IN DEX 2-0.25 GM/50ML IV SOLN
2.2500 g | Freq: Three times a day (TID) | INTRAVENOUS | Status: DC
  Administered 2017-07-25 – 2017-07-29 (×12): 2.25 g via INTRAVENOUS
  Filled 2017-07-25 (×14): qty 50

## 2017-07-25 MED ORDER — HYDRALAZINE HCL 20 MG/ML IJ SOLN
5.0000 mg | INTRAMUSCULAR | Status: DC | PRN
  Administered 2017-07-25: 5 mg via INTRAVENOUS
  Filled 2017-07-25: qty 1

## 2017-07-25 MED ORDER — SODIUM CHLORIDE 0.9 % IV BOLUS
500.0000 mL | Freq: Once | INTRAVENOUS | Status: AC
  Administered 2017-07-25: 500 mL via INTRAVENOUS

## 2017-07-25 MED ORDER — HYDRALAZINE HCL 20 MG/ML IJ SOLN
10.0000 mg | Freq: Once | INTRAMUSCULAR | Status: AC
  Administered 2017-07-25: 10 mg via INTRAVENOUS
  Filled 2017-07-25: qty 1

## 2017-07-25 MED ORDER — SODIUM CHLORIDE 0.9 % IV BOLUS
500.0000 mL | Freq: Once | INTRAVENOUS | Status: AC
Start: 2017-07-25 — End: 2017-07-25
  Administered 2017-07-25: 500 mL via INTRAVENOUS

## 2017-07-25 MED ORDER — ACETAMINOPHEN 325 MG PO TABS: 650.0000 mg | ORAL_TABLET | Freq: Four times a day (QID) | ORAL | Status: DC | PRN

## 2017-07-25 MED ORDER — SODIUM CHLORIDE 0.9 % IV SOLN
INTRAVENOUS | Status: DC
  Administered 2017-07-25: 12:00:00 via INTRAVENOUS

## 2017-07-25 NOTE — ED Provider Notes (Signed)
Patient sent to me by Dr. Eulis Foster pending ultrasound results which are noted.  Patient also has evidence of UTI given Rocephin here.  Also has evidence of acute kidney injury and will admit to the hospitalist service   Lacretia Leigh, MD 07/25/17 2025

## 2017-07-25 NOTE — ED Notes (Signed)
Patient transported to MRI 

## 2017-07-25 NOTE — ED Triage Notes (Signed)
Per EMS, patient comes from home. Lives with his relative. C/o generalized weakness x4 days, decreased appetite and vomiting. Unable to take his BP meds for 3 days. Hypertensive. No fevers, diarrhea, chills. 18g L arm. Chronic arthritic pain in R arm. Son has [place]- lives in [place]. Pt is alert and oriented.

## 2017-07-25 NOTE — ED Provider Notes (Signed)
[facility] DEPT Provider Note   CSN: [id number] Arrival date & time: 07/25/17  1125     History   Chief Complaint No chief complaint on file.   HPI Mathew Waller is a 84 y.o. male.  HPI   He is here for evaluation of generalized weakness for several days.  He has had anorexia, and vomiting.  He is not taking his blood pressure medication.  Patient is here with his son who came from [place] to visit his father.  He arrived yesterday and noticed that his father had pain in his right wrist that made it difficult to move his right hand.  There is been no known trauma.  The patient has ongoing vomiting, for several days, preventing him from tolerating liquid and solid foods, or his medications.  There is been no diarrhea.  According to the son, the patient's mental status is at baseline.  The son with the patient is his healthcare power of attorney.  There have been no other recent illnesses known to the son.  He did see his PCP recently, but the son was not informed about the hyponatremia that was found on that visit.  It is unclear if there was intervention taken after that diagnosis.  There are no other known modifying factors.  Level 5 caveat-altered mental status  Past Medical History:  Diagnosis Date  . Dementia   . Dyslipidemia   . Family history of colon cancer    son deseased with colon cancer  . Gout   . Renal disease    stage 3  . Thrombocyte disorder   . Unspecified essential hypertension     Patient Active Problem List   Diagnosis Date Noted  . Weight loss 01/20/2012  . Thrombocytosis ([place]) 12/01/2011    Past Surgical History:  Procedure Laterality Date  . CATARACT EXTRACTION          Home Medications    Prior to Admission medications   Medication Sig Start Date End Date Taking? Authorizing Provider  amLODipine (NORVASC) 10 MG tablet Take 10 mg by mouth daily.   Yes [provider]  hydrALAZINE (APRESOLINE) 25 MG  tablet Take 25 mg by mouth 2 (two) times daily. 06/22/17  Yes [provider]  metoprolol succinate (TOPROL-XL) 100 MG 24 hr tablet Take 100 mg by mouth daily. 03/24/16  Yes [provider]  trolamine salicylate (ASPERCREME) 10 % cream Apply 1 application topically as needed for muscle pain.   Yes [provider]    Family History Family History  Problem Relation Age of Onset  . Colon cancer Son        died age 33  . Diabetes Unknown   . Hypertension Unknown   . Colon cancer Daughter     Social History Social History   Tobacco Use  . Smoking status: Former Smoker    Packs/day: 0.25    Years: 38.00    Pack years: 9.50    Types: Cigarettes    Last attempt to quit: 12/15/1976    Years since quitting: 40.6  . Smokeless tobacco: Never Used  Substance Use Topics  . Alcohol use: No  . Drug use: No     Allergies   Patient has no known allergies.   Review of Systems Review of Systems  Unable to perform ROS: Mental status change     Physical Exam Updated Vital Signs BP (S) (!) 210/100 Comment: md aware  Pulse 92   Temp (!) 97.3  F (36.3 C) (Oral)   Resp 20   Ht 5\' 6"  (1.676 m)   Wt 67.1 kg (148 lb)   SpO2 99%   BMI 23.89 kg/m   Physical Exam  Constitutional: He appears well-developed. He appears distressed (He appears somewhat sleepy and uncomfortable).  Elderly, frail.  He is able to follow commands accurately.  HENT:  Head: Normocephalic and atraumatic.  Right Ear: External ear normal.  Left Ear: External ear normal.  Moderately dry oral mucous membranes without other lesions.  Eyes: Pupils are equal, round, and reactive to light. Conjunctivae and EOM are normal. Right eye exhibits no discharge. Left eye exhibits no discharge.  Neck: Normal range of motion and phonation normal. Neck supple.  Cardiovascular: Normal rate, regular rhythm and normal heart sounds.  Pulmonary/Chest: Effort normal and breath sounds normal. He exhibits no  bony tenderness.  Abdominal: Soft. There is no tenderness.  Musculoskeletal: He exhibits no edema.  Normal strength arms and legs bilaterally.  Right hand with decreased motion and pain in second, third and fourth MTP joints.  Neurological: He is alert. No cranial nerve deficit or sensory deficit. He exhibits normal muscle tone. Coordination normal.  Skin: Skin is warm, dry and intact.  Psychiatric: He has a normal mood and affect. His behavior is normal.  Nursing note and vitals reviewed.    ED Treatments / Results  Labs (all labs ordered are listed, but only abnormal results are displayed) Labs Reviewed  COMPREHENSIVE METABOLIC PANEL - Abnormal; Notable for the following components:      Result Value   Sodium 146 (*)    Glucose, Bld 126 (*)    BUN 56 (*)    Creatinine, Ser 2.52 (*)    Albumin 2.9 (*)    AST 142 (*)    ALT 152 (*)    Alkaline Phosphatase 288 (*)    Total Bilirubin 3.2 (*)    GFR calc non Af Amer 20 (*)    GFR calc Af Amer 24 (*)    All other components within normal limits  CBC WITH DIFFERENTIAL/PLATELET - Abnormal; Notable for the following components:   WBC 13.0 (*)    Platelets 854 (*)    Neutro Abs 10.7 (*)    Monocytes Absolute 1.2 (*)    All other components within normal limits  URINALYSIS, ROUTINE W REFLEX MICROSCOPIC    EKG None  Radiology No results found.  Procedures .Critical Care Performed by: Daleen Bo, MD Authorized by: Daleen Bo, MD   Critical care provider statement:    Critical care time (minutes):  35   Critical care start time:  07/25/2017 11:44 AM   Critical care end time:  07/25/2017 3:55 PM   Critical care time was exclusive of:  Separately billable procedures and treating other patients   Critical care was necessary to treat or prevent imminent or life-threatening deterioration of the following conditions:  Dehydration   Critical care was time spent personally by me on the following activities:  Blood draw for  specimens, development of treatment plan with patient or surrogate, discussions with consultants, evaluation of patient's response to treatment, examination of patient, obtaining history from patient or surrogate, ordering and performing treatments and interventions, ordering and review of laboratory studies, pulse oximetry, re-evaluation of patient's condition, review of old charts and ordering and review of radiographic studies   (including critical care time)  Medications Ordered in ED Medications  0.9 %  sodium chloride infusion ( Intravenous New Bag/Given 07/25/17 1205)  hydrALAZINE (APRESOLINE) injection 10 mg (has no administration in time range)  sodium chloride 0.9 % bolus 500 mL (0 mLs Intravenous Stopped 07/25/17 1404)  sodium chloride 0.9 % bolus 500 mL (0 mLs Intravenous Stopped 07/25/17 1514)     Initial Impression / Assessment and Plan / ED Course  I have reviewed the triage vital signs and the nursing notes.  Pertinent labs & imaging results that were available during my care of the patient were reviewed by me and considered in my medical decision making (see chart for details).  Clinical Course as of Jul 26 1555  Sat Jul 25, 2017  1540 Normal except elevated sodium, elevated glucose, elevated BUN, elevated creatinine, low albumin, elevated AST, elevated ALT, elevated alkaline phosphatase, elevated total bilirubin, decreased GFR  Comprehensive metabolic panel(!) [EW]  7902 Normal except white count, and platelets  CBC with Differential(!) [EW]  1540 High  BP(!): 167/126 [EW]  1543 For trending of creatinine and BUN no recent labs but significantly higher than baseline 5 years ago.   [EW]    Clinical Course User Index [EW] Daleen Bo, MD     Patient Vitals for the past 24 hrs:  BP Temp Temp src Pulse Resp SpO2 Height Weight  07/25/17 1544 (!) 210/100 - - 92 20 99 % - -  07/25/17 1543 (!) 236/102 - - 96 19 100 % - -  07/25/17 1351 (!) 167/126 - - 92 13 99 % - -    07/25/17 1140 - - - - - - 5\' 6"  (1.676 m) 67.1 kg (148 lb)  07/25/17 1139 (!) 170/100 (!) 97.3 F (36.3 C) Oral (!) 105 18 100 % - -    3:28 PM Reevaluation with update and discussion. After initial assessment and treatment, an updated evaluation reveals he has been able to drink about 4 ounces of liquid but not eaten anything.  Son (healthcare power of attorney) updated on findings and plan, to obtain ultrasound and admit, he is agreeable.Daleen Bo   Medical Decision Making: Vomiting with abdominal pain, and significant dehydration, AKI,, and hyponatremia.  Consider acute cholecystitis, with elevated WBC, and transaminitis.  Elevated blood pressure related to not be, tolerate oral medications.  Doubt hypertensive urgency.  Patient will require admission.    CRITICAL CARE-yes Performed by: Daleen Bo  Nursing Notes Reviewed/ Care Coordinated Applicable Imaging Reviewed Interpretation of Laboratory Data incorporated into ED treatment   Plan-admit following ultrasound imaging, for further care and management  Final Clinical Impressions(s) / ED Diagnoses   Final diagnoses:  Non-intractable vomiting with nausea, unspecified vomiting type  Transaminitis  AKI (acute kidney injury) ([place])  Hypertension, unspecified type    ED Discharge Orders    None       Daleen Bo, MD 07/25/17 1558

## 2017-07-25 NOTE — ED Notes (Signed)
Patient helped to stand and sit on edge of bed to attempt to urinate. Pt was unsuccessful. Per patients son, he has been having trouble going since a few days ago. Will let MD know.

## 2017-07-25 NOTE — ED Notes (Signed)
MRI tech called to report they were unable to get accurate MRI capture due to pt pulling equipment off and not following instructions. Hx of dementia.

## 2017-07-25 NOTE — ED Notes (Signed)
Water given per patient request. Refusing food at this time. Patient and son aware a urine sample is needed- urinal at bedside.

## 2017-07-25 NOTE — ED Notes (Signed)
ED TO INPATIENT HANDOFF REPORT  Name/Age/Gender Mathew Waller 84 y.o. male  Code Status   Home/SNF/Other Home  Chief Complaint weakness  Level of Care/Admitting Diagnosis ED Disposition    ED Disposition Condition [facility] Area: [place] [[id number]]  Level of Care: Telemetry [5]  Admit to tele based on following criteria: Monitor for Ischemic changes  Diagnosis: ARF (acute renal failure) [facility]) [[id number]]  Admitting Physician: Rise Patience [phone]  Attending Physician: Rise Patience [phone]  Estimated length of stay: past midnight tomorrow  Certification:: I certify this patient will need inpatient services for at least 2 midnights  PT Class (Do Not Modify): Inpatient [101]  PT Acc Code (Do Not Modify): Private [1]       Medical History Past Medical History:  Diagnosis Date  . Dementia   . Dyslipidemia   . Family history of colon cancer    son deseased with colon cancer  . Gout   . Renal disease    stage 3  . Thrombocyte disorder ([place])   . Unspecified essential hypertension     Allergies No Known Allergies  IV Location/Drains/Wounds Patient Lines/Drains/Airways Status   Active Line/Drains/Airways    Name:   Placement date:   Placement time:   Site:   Days:   Peripheral IV 07/25/17 Left Antecubital   07/25/17    1134    Antecubital   less than 1          Labs/Imaging Results for orders placed or performed during the hospital encounter of 07/25/17 (from the past 48 hour(s))  Comprehensive metabolic panel     Status: Abnormal   Collection Time: 07/25/17 12:04 PM  Result Value Ref Range   Sodium 146 (H) 135 - 145 mmol/L   Potassium 4.8 3.5 - 5.1 mmol/L   Chloride 107 101 - 111 mmol/L   CO2 26 22 - 32 mmol/L   Glucose, Bld 126 (H) 65 - 99 mg/dL   BUN 56 (H) 6 - 20 mg/dL   Creatinine, Ser 2.52 (H) 0.61 - 1.24 mg/dL   Calcium 10.1 8.9 - 10.3 mg/dL   Total Protein 7.8 6.5 - 8.1 g/dL   Albumin 2.9 (L) 3.5 -  5.0 g/dL   AST 142 (H) 15 - 41 U/L   ALT 152 (H) 17 - 63 U/L   Alkaline Phosphatase 288 (H) 38 - 126 U/L   Total Bilirubin 3.2 (H) 0.3 - 1.2 mg/dL   GFR calc non Af Amer 20 (L) >60 mL/min   GFR calc Af Amer 24 (L) >60 mL/min    Comment: (NOTE) The eGFR has been calculated using the CKD EPI equation. This calculation has not been validated in all clinical situations. eGFR's persistently <60 mL/min signify possible Chronic Kidney Disease.    Anion gap 13 5 - 15    Comment: Performed at [facility], [place] [address] [place], [place] [address]  CBC with Differential     Status: Abnormal   Collection Time: 07/25/17 12:04 PM  Result Value Ref Range   WBC 13.0 (H) 4.0 - 10.5 K/uL   RBC 5.04 4.22 - 5.81 MIL/uL   Hemoglobin 14.2 13.0 - 17.0 g/dL   HCT 43.0 39.0 - 52.0 %   MCV 85.3 78.0 - 100.0 fL   MCH 28.2 26.0 - 34.0 pg   MCHC 33.0 30.0 - 36.0 g/dL   RDW 13.5 11.5 - 15.5 %   Platelets 854 (H) 150 - 400  K/uL   Neutrophils Relative % 82 %   Neutro Abs 10.7 (H) 1.7 - 7.7 K/uL   Lymphocytes Relative 8 %   Lymphs Abs 1.0 0.7 - 4.0 K/uL   Monocytes Relative 10 %   Monocytes Absolute 1.2 (H) 0.1 - 1.0 K/uL   Eosinophils Relative 0 %   Eosinophils Absolute 0.0 0.0 - 0.7 K/uL   Basophils Relative 0 %   Basophils Absolute 0.0 0.0 - 0.1 K/uL    Comment: Performed at [facility], [place] [address], [place], [place] [address]  Urinalysis, Routine w reflex microscopic     Status: Abnormal   Collection Time: 07/25/17  3:40 PM  Result Value Ref Range   Color, Urine AMBER (A) YELLOW    Comment: BIOCHEMICALS MAY BE AFFECTED BY COLOR   APPearance CLOUDY (A) CLEAR   Specific Gravity, Urine 1.021 1.005 - 1.030   pH 5.0 5.0 - 8.0   Glucose, UA NEGATIVE NEGATIVE mg/dL   Hgb urine dipstick MODERATE (A) NEGATIVE   Bilirubin Urine MODERATE (A) NEGATIVE   Ketones, ur 5 (A) NEGATIVE mg/dL   Protein, ur 100 (A) NEGATIVE mg/dL   Nitrite POSITIVE (A) NEGATIVE    Leukocytes, UA NEGATIVE NEGATIVE   RBC / HPF 6-10 0 - 5 RBC/hpf   WBC, UA 6-10 0 - 5 WBC/hpf   Bacteria, UA FEW (A) NONE SEEN   Squamous Epithelial / LPF 0-5 0 - 5   Mucus PRESENT    Hyaline Casts, UA PRESENT    Amorphous Crystal PRESENT     Comment: Performed at [facility], [place] [address] [place], [place] [address]   US Abdomen Complete  Result Date: 07/25/2017 CLINICAL DATA:  Acute renal failure. Abdominal pain. Vomiting. Anorexia. EXAM: ABDOMEN ULTRASOUND COMPLETE COMPARISON:  None. FINDINGS: Gallbladder: There is a 1.7 cm calcified shadowing gallstone in the contracted gallbladder. Apparent mild diffuse gallbladder wall thickening. No pericholecystic fluid. No sonographic Murphy's sign. Common bile duct: Diameter: 8 mm, mildly dilated. No choledocholithiasis demonstrated, noting nonvisualization of the distal common bile duct due to overlying bowel gas. Liver: Liver parenchyma is mildly heterogeneous, with normal echogenicity. No liver mass. Portal vein is patent on color Doppler imaging with normal direction of blood flow towards the liver. IVC: No abnormality visualized. Pancreas: Visualized portion unremarkable, with top-normal main pancreatic duct. Spleen: Size and appearance within normal limits. Right Kidney: Length: 9.7 cm. Echogenic right renal parenchyma. No right hydronephrosis. No right renal mass. Left Kidney: Length: 10.1 cm. Echogenic left renal parenchyma. No left hydronephrosis. No left renal mass. Abdominal aorta: Atherosclerotic abdominal aorta with ectatic 2.7 cm infrarenal abdominal aorta. Other findings: Small volume upper abdominal ascites. IMPRESSION: 1. Cholelithiasis. Contracted gallbladder. Apparent mild diffuse gallbladder wall thickening. No pericholecystic fluid. No sonographic Murphy's sign. Sonographic findings are most compatible with chronic calculous cholecystitis. 2. Mildly dilated common bile duct (8 mm diameter). No choledocholithiasis  demonstrated, noting nonvisualization of the distal CBD due to overlying bowel gas. Recommend correlation with serum bilirubin levels. Consider MRI abdomen with MRCP without and with IV contrast for further evaluation, as clinically warranted. 3. Nonspecific mild liver parenchymal heterogeneity, cannot exclude hepatocellular disease. No liver mass. 4. Echogenic kidneys, indicative of nonspecific renal parenchymal disease of uncertain chronicity. No hydronephrosis. 5. Ectatic 2.7 cm infrarenal abdominal aorta, at risk for aneurysm development. Recommend follow-up aortic ultrasound in 5 years. This recommendation follows ACR consensus guidelines: White Paper of the ACR Incidental Findings Committee II on Vascular Findings. J Am Coll  Radiol 2013; 11:216-244. Electronically Signed   By: Ilona Sorrel M.D.   On: 07/25/2017 19:45   Dg Hand Complete Right  Result Date: 07/25/2017 CLINICAL DATA:  Hand pain and weakness, no known injury, initial encounter EXAM: RIGHT HAND - COMPLETE 3+ VIEW COMPARISON:  None. FINDINGS: Examination is somewhat limited secondary to patient's inability to straighten is fingers. Mild osteopenia is seen. No definitive acute fracture is seen. Diffuse vascular calcifications are noted. IMPRESSION: No acute abnormality noted. Electronically Signed   By: Inez Catalina M.D.   On: 07/25/2017 16:56    Pending Labs Unresulted Labs (From admission, onward)   None      Vitals/Pain Today's Vitals   07/25/17 1642 07/25/17 1700 07/25/17 1933 07/25/17 1939  BP: (!) 199/93 (!) 186/106 (!) 200/97   Pulse: 90 95 (!) 107   Resp: 19 (!) 21 19   Temp:      TempSrc:      SpO2: 100% 100% 100%   Weight:      Height:      PainSc:    0-No pain    Isolation Precautions No active isolations  Medications Medications  0.9 %  sodium chloride infusion ( Intravenous New Bag/Given 07/25/17 1205)  cefTRIAXone (ROCEPHIN) 1 g in sodium chloride 0.9 % 100 mL IVPB (has no administration in time range)   hydrALAZINE (APRESOLINE) injection 5 mg (has no administration in time range)  sodium chloride 0.9 % bolus 500 mL (0 mLs Intravenous Stopped 07/25/17 1404)  sodium chloride 0.9 % bolus 500 mL (0 mLs Intravenous Stopped 07/25/17 1514)  hydrALAZINE (APRESOLINE) injection 10 mg (10 mg Intravenous Given 07/25/17 1601)    Mobility non-ambulatory (due to weakness)

## 2017-07-25 NOTE — Progress Notes (Signed)
Pharmacy Antibiotic Note  Mathew Waller is a 84 y.o. male admitted on 07/25/2017 with intra-abdominal infection.  Ultrasound shows gallstone.  Pharmacy has been consulted for Zosyn dosing.  Plan: Zosyn 2.25gm IV q8h Follow renal function  Height: 5\' 6"  (167.6 cm) Weight: 148 lb (67.1 kg) IBW/kg (Calculated) : 63.8  Temp (24hrs), Avg:97.3 F (36.3 C), Min:97.3 F (36.3 C), Max:97.3 F (36.3 C)  Recent Labs  Lab 07/25/17 1204  WBC 13.0*  CREATININE 2.52*    Estimated Creatinine Clearance: 16.2 mL/min (A) (by C-G formula based on SCr of 2.52 mg/dL (H)).    No Known Allergies  Antimicrobials this admission: 6/1 Zosyn >>    Dose adjustments this admission:  Microbiology results:  Thank you for allowing pharmacy to be a part of this patient's care.  Mathew Waller, Mathew Waller 07/25/2017 9:07 PM

## 2017-07-25 NOTE — H&P (Signed)
History and Physical    Mathew Waller TKW:[id number] DOB: Aug 03, 1921 DOA: 07/25/2017  PCP: [name], MD (Inactive)  Patient coming from: Home.  History obtained from patient and patient's son.  Chief Complaint: Nausea vomiting.  HPI: Mathew Waller is a 84 y.o. male with history of hypertension presents to the ER with complaints of persistent nausea vomiting over the last 4 days.  Unable to keep in anything.  Denies any abdominal pain chest pain or shortness of breath.  At the same time patient also has noticed to have increased pain in the right hand with difficult to make fist.  Denies any trauma or fall.  Patient was unable to take his medications due to the vomiting.  Denies any diarrhea.  ED Course: In the ER labs revealed increased creatinine from baseline with elevated AST ALT and bilirubin.  X-ray of the right hand did not show anything acute.  UA shows features concerning for UTI.  Sonogram of the abdomen done shows features concerning for chronic cholecystitis with CBD dilatation.  MRCP has been ordered which is pending.  Patient started on empiric antibiotics.  Patient is also found to have markedly elevated blood pressure.  Patient appears nonfocal.  Review of Systems: As per HPI, rest all negative.   Past Medical History:  Diagnosis Date  . Dementia   . Dyslipidemia   . Family history of colon cancer    son deseased with colon cancer  . Gout   . Renal disease    stage 3  . Thrombocyte disorder ([place])   . Unspecified essential hypertension     Past Surgical History:  Procedure Laterality Date  . CATARACT EXTRACTION       reports that he quit smoking about 40 years ago. His smoking use included cigarettes. He has a 9.50 pack-year smoking history. He has never used smokeless tobacco. He reports that he does not drink alcohol or use drugs.  No Known Allergies  Family History  Problem Relation Age of Onset  . Colon cancer Son        died age 73  . Diabetes  Unknown   . Hypertension Unknown   . Colon cancer Daughter     Prior to Admission medications   Medication Sig Start Date End Date Taking? Authorizing Provider  amLODipine (NORVASC) 10 MG tablet Take 10 mg by mouth daily.   Yes [provider]  hydrALAZINE (APRESOLINE) 25 MG tablet Take 25 mg by mouth 2 (two) times daily. 06/22/17  Yes [provider]  metoprolol succinate (TOPROL-XL) 100 MG 24 hr tablet Take 100 mg by mouth daily. 03/24/16  Yes [provider]  trolamine salicylate (ASPERCREME) 10 % cream Apply 1 application topically as needed for muscle pain.   Yes [provider]    Physical Exam: Vitals:   07/25/17 1700 07/25/17 1933 07/25/17 2000 07/25/17 2030  BP: (!) 186/106 (!) 200/97 (!) 170/81 (!) 205/86  Pulse: 95 (!) 107 98 (!) 108  Resp: (!) 21 19 17 19   Temp:      TempSrc:      SpO2: 100% 100% 99% 99%  Weight:      Height:          Constitutional: Moderately built and nourished. Vitals:   07/25/17 1700 07/25/17 1933 07/25/17 2000 07/25/17 2030  BP: (!) 186/106 (!) 200/97 (!) 170/81 (!) 205/86  Pulse: 95 (!) 107 98 (!) 108  Resp: (!) 21 19 17 19   Temp:  TempSrc:      SpO2: 100% 100% 99% 99%  Weight:      Height:       Eyes: Anicteric no pallor. ENMT: No discharge from the ears eyes nose or mouth. Neck: No mass felt.  No neck rigidity.  No JVD appreciated. Respiratory: No rhonchi or crepitations. Cardiovascular: S1-S2 heard no murmurs appreciated. Abdomen: Soft nontender bowel sounds present. Musculoskeletal: Right hand is unable to make a fist due to pain and swelling. Skin: No rash. Neurologic: Alert awake oriented to time place and person.  Moves all extremities. Psychiatric: Appears normal per normal affect.   Labs on Admission: I have personally reviewed following labs and imaging studies  CBC: Recent Labs  Lab 07/25/17 1204  WBC 13.0*  NEUTROABS 10.7*  HGB 14.2  HCT 43.0  MCV 85.3  PLT 854*    Basic Metabolic Panel: Recent Labs  Lab 07/25/17 1204  NA 146*  K 4.8  CL 107  CO2 26  GLUCOSE 126*  BUN 56*  CREATININE 2.52*  CALCIUM 10.1   GFR: Estimated Creatinine Clearance: 16.2 mL/min (A) (by C-G formula based on SCr of 2.52 mg/dL (H)). Liver Function Tests: Recent Labs  Lab 07/25/17 1204  AST 142*  ALT 152*  ALKPHOS 288*  BILITOT 3.2*  PROT 7.8  ALBUMIN 2.9*   No results for input(s): LIPASE, AMYLASE in the last 168 hours. No results for input(s): AMMONIA in the last 168 hours. Coagulation Profile: No results for input(s): INR, PROTIME in the last 168 hours. Cardiac Enzymes: No results for input(s): CKTOTAL, CKMB, CKMBINDEX, TROPONINI in the last 168 hours. BNP (last 3 results) No results for input(s): PROBNP in the last 8760 hours. HbA1C: No results for input(s): HGBA1C in the last 72 hours. CBG: No results for input(s): GLUCAP in the last 168 hours. Lipid Profile: No results for input(s): CHOL, HDL, LDLCALC, TRIG, CHOLHDL, LDLDIRECT in the last 72 hours. Thyroid Function Tests: No results for input(s): TSH, T4TOTAL, FREET4, T3FREE, THYROIDAB in the last 72 hours. Anemia Panel: No results for input(s): VITAMINB12, FOLATE, FERRITIN, TIBC, IRON, RETICCTPCT in the last 72 hours. Urine analysis:    Component Value Date/Time   COLORURINE AMBER (A) 07/25/2017 1540   APPEARANCEUR CLOUDY (A) 07/25/2017 1540   LABSPEC 1.021 07/25/2017 1540   PHURINE 5.0 07/25/2017 1540   GLUCOSEU NEGATIVE 07/25/2017 1540   HGBUR MODERATE (A) 07/25/2017 1540   BILIRUBINUR MODERATE (A) 07/25/2017 1540   KETONESUR 5 (A) 07/25/2017 1540   PROTEINUR 100 (A) 07/25/2017 1540   NITRITE POSITIVE (A) 07/25/2017 1540   LEUKOCYTESUR NEGATIVE 07/25/2017 1540   Sepsis Labs: @LABRCNTIP (procalcitonin:4,lacticidven:4) )No results found for this or any previous visit (from the past 240 hour(s)).   Radiological Exams on Admission: US Abdomen Complete  Result Date:  07/25/2017 CLINICAL DATA:  Acute renal failure. Abdominal pain. Vomiting. Anorexia. EXAM: ABDOMEN ULTRASOUND COMPLETE COMPARISON:  None. FINDINGS: Gallbladder: There is a 1.7 cm calcified shadowing gallstone in the contracted gallbladder. Apparent mild diffuse gallbladder wall thickening. No pericholecystic fluid. No sonographic Murphy's sign. Common bile duct: Diameter: 8 mm, mildly dilated. No choledocholithiasis demonstrated, noting nonvisualization of the distal common bile duct due to overlying bowel gas. Liver: Liver parenchyma is mildly heterogeneous, with normal echogenicity. No liver mass. Portal vein is patent on color Doppler imaging with normal direction of blood flow towards the liver. IVC: No abnormality visualized. Pancreas: Visualized portion unremarkable, with top-normal main pancreatic duct. Spleen: Size and appearance within normal limits. Right Kidney: Length: 9.7 cm.  Echogenic right renal parenchyma. No right hydronephrosis. No right renal mass. Left Kidney: Length: 10.1 cm. Echogenic left renal parenchyma. No left hydronephrosis. No left renal mass. Abdominal aorta: Atherosclerotic abdominal aorta with ectatic 2.7 cm infrarenal abdominal aorta. Other findings: Small volume upper abdominal ascites. IMPRESSION: 1. Cholelithiasis. Contracted gallbladder. Apparent mild diffuse gallbladder wall thickening. No pericholecystic fluid. No sonographic Murphy's sign. Sonographic findings are most compatible with chronic calculous cholecystitis. 2. Mildly dilated common bile duct (8 mm diameter). No choledocholithiasis demonstrated, noting nonvisualization of the distal CBD due to overlying bowel gas. Recommend correlation with serum bilirubin levels. Consider MRI abdomen with MRCP without and with IV contrast for further evaluation, as clinically warranted. 3. Nonspecific mild liver parenchymal heterogeneity, cannot exclude hepatocellular disease. No liver mass. 4. Echogenic kidneys, indicative of  nonspecific renal parenchymal disease of uncertain chronicity. No hydronephrosis. 5. Ectatic 2.7 cm infrarenal abdominal aorta, at risk for aneurysm development. Recommend follow-up aortic ultrasound in 5 years. This recommendation follows ACR consensus guidelines: White Paper of the ACR Incidental Findings Committee II on Vascular Findings. J Am Coll Radiol 2013; 10:789-794. Electronically Signed   By: Ilona Sorrel M.D.   On: 07/25/2017 19:45   Dg Hand Complete Right  Result Date: 07/25/2017 CLINICAL DATA:  Hand pain and weakness, no known injury, initial encounter EXAM: RIGHT HAND - COMPLETE 3+ VIEW COMPARISON:  None. FINDINGS: Examination is somewhat limited secondary to patient's inability to straighten is fingers. Mild osteopenia is seen. No definitive acute fracture is seen. Diffuse vascular calcifications are noted. IMPRESSION: No acute abnormality noted. Electronically Signed   By: Inez Catalina M.D.   On: 07/25/2017 16:56     Assessment/Plan Active Problems:   ARF (acute renal failure) (HCC)   Hypertensive urgency   Nausea & vomiting   Cholelithiasis    1. Persistent nausea vomiting with elevated LFTs and cholelithiasis with features concerning for chronic cholecystitis with CBD dilation -primary concerning for any CBD obstruction with possible cholecystitis.  Abdomen appears benign on exam.  I have placed patient on Zosyn.  I will order MRCP which is pending.  Will need to get surgery and/or GI consult based on MRCP finding. 2. Possible UTI and empiric antibiotics follow cultures. 3. Hypertensive urgency likely secondary to patient not able to take his oral medications.  I have placed patient on PRN IV hydralazine and scheduled metoprolol IV. 4. Right hand pain and swelling likely from gout.  X-rays do not show any fracture.  Will check uric acid level.  No definite signs of infection.  Probably will need steroids. 5. Acute renal failure on chronic kidney disease stage III.  Creatinine  has increased from baseline of around 1.9.  Likely from persistent vomiting.  Gently hydrate and follow metabolic panel.  Follow intake output.  Ultrasound abdomen does not show any definite obstruction.  Does show medical renal disease.  Not sure if patient drinks alcohol every day.  Patient not very forthcoming.    DVT prophylaxis: SCDs in anticipation of possible procedure. Code Status: Full code. Family Communication: Patient's son. Disposition Plan: Home. Consults called: None. Admission status: Inpatient.   Rise Patience MD [facility] Pager [phone].  If 7PM-7AM, please contact night-coverage [email or web address] Password [facility]  07/25/2017, 10:42 PM

## 2017-07-26 ENCOUNTER — Other Ambulatory Visit: Payer: Self-pay

## 2017-07-26 ENCOUNTER — Inpatient Hospital Stay (HOSPITAL_COMMUNITY): Payer: Medicare HMO

## 2017-07-26 LAB — CBC WITH DIFFERENTIAL/PLATELET
BASOS ABS: 0 10*3/uL (ref 0.0–0.1)
BASOS PCT: 0 %
EOS ABS: 0 10*3/uL (ref 0.0–0.7)
Eosinophils Relative: 0 %
HCT: 44.1 % (ref 39.0–52.0)
HEMOGLOBIN: 14.5 g/dL (ref 13.0–17.0)
LYMPHS ABS: 1.9 10*3/uL (ref 0.7–4.0)
Lymphocytes Relative: 12 %
MCH: 27.4 pg (ref 26.0–34.0)
MCHC: 32.9 g/dL (ref 30.0–36.0)
MCV: 83.4 fL (ref 78.0–100.0)
Monocytes Absolute: 0.8 10*3/uL (ref 0.1–1.0)
Monocytes Relative: 5 %
NEUTROS PCT: 83 %
Neutro Abs: 12.9 10*3/uL — ABNORMAL HIGH (ref 1.7–7.7)
Platelets: 762 10*3/uL — ABNORMAL HIGH (ref 150–400)
RBC: 5.29 MIL/uL (ref 4.22–5.81)
RDW: 13.5 % (ref 11.5–15.5)
WBC: 15.6 10*3/uL — AB (ref 4.0–10.5)

## 2017-07-26 LAB — TROPONIN I
TROPONIN I: 0.09 ng/mL — AB (ref <0.03)
TROPONIN I: 0.06 ng/mL — AB (ref <0.03)

## 2017-07-26 LAB — BASIC METABOLIC PANEL
Anion gap: 14 (ref 5–15)
BUN: 55 mg/dL — ABNORMAL HIGH (ref 6–20)
CO2: 20 mmol/L — AB (ref 22–32)
Calcium: 9.5 mg/dL (ref 8.9–10.3)
Chloride: 111 mmol/L (ref 101–111)
Creatinine, Ser: 2.04 mg/dL — ABNORMAL HIGH (ref 0.61–1.24)
GFR calc Af Amer: 30 mL/min — ABNORMAL LOW (ref >60)
GFR, EST NON AFRICAN AMERICAN: 26 mL/min — AB (ref >60)
GLUCOSE: 109 mg/dL — AB (ref 65–99)
POTASSIUM: 4.2 mmol/L (ref 3.5–5.1)
Sodium: 145 mmol/L (ref 135–145)

## 2017-07-26 LAB — HEPATIC FUNCTION PANEL
ALBUMIN: 2.7 g/dL — AB (ref 3.5–5.0)
ALT: 139 U/L — ABNORMAL HIGH (ref 17–63)
AST: 129 U/L — ABNORMAL HIGH (ref 15–41)
Alkaline Phosphatase: 283 U/L — ABNORMAL HIGH (ref 38–126)
BILIRUBIN DIRECT: 1.9 mg/dL — AB (ref 0.1–0.5)
BILIRUBIN TOTAL: 3.1 mg/dL — AB (ref 0.3–1.2)
Indirect Bilirubin: 1.2 mg/dL — ABNORMAL HIGH (ref 0.3–0.9)
Total Protein: 7 g/dL (ref 6.5–8.1)

## 2017-07-26 LAB — GLUCOSE, CAPILLARY
Glucose-Capillary: 102 mg/dL — ABNORMAL HIGH (ref 65–99)
Glucose-Capillary: 107 mg/dL — ABNORMAL HIGH (ref 65–99)
Glucose-Capillary: 110 mg/dL — ABNORMAL HIGH (ref 65–99)
Glucose-Capillary: 111 mg/dL — ABNORMAL HIGH (ref 65–99)
Glucose-Capillary: 99 mg/dL (ref 65–99)

## 2017-07-26 LAB — URIC ACID: URIC ACID, SERUM: 11.4 mg/dL — AB (ref 4.4–7.6)

## 2017-07-26 MED ORDER — METOPROLOL TARTRATE 5 MG/5ML IV SOLN
5.0000 mg | Freq: Four times a day (QID) | INTRAVENOUS | Status: DC
  Administered 2017-07-26 – 2017-07-29 (×12): 5 mg via INTRAVENOUS
  Filled 2017-07-26 (×12): qty 5

## 2017-07-26 MED ORDER — HYDRALAZINE HCL 20 MG/ML IJ SOLN
10.0000 mg | Freq: Four times a day (QID) | INTRAMUSCULAR | Status: DC
  Administered 2017-07-26 – 2017-07-29 (×13): 10 mg via INTRAVENOUS
  Filled 2017-07-26 (×13): qty 1

## 2017-07-26 NOTE — Consult Note (Signed)
Reason for Consult: Gallstones chronic / cholecystitis Referring Physician: Wynelle Cleveland MD   Mathew Waller is an 84 y.o. male.  HPI: Asked to see patient at the request of Dr. Wynelle Cleveland MD for epigastric pain, nausea and vomiting.  The patient has severe dementia and came in with acute renal failure secondary to nausea and vomiting.  He was worked up and found to have a contracted gallbladder with gallstones.  His white count is 15,000 and his liver function studies are elevated with a bilirubin of 3.1.  Currently denies abdominal pain, nausea or vomiting but history is limited due to his dementia.  Ultrasound shows a contracted gallbladder with gallstones.  MRI is pending.  He complains of no pain but again his history is difficult to obtain secondary to severe dementia.  Past Medical History:  Diagnosis Date  . Dementia   . Dyslipidemia   . Family history of colon cancer    son deseased with colon cancer  . Gout   . Renal disease    stage 3  . 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 Unspecified essential hypertension     Past Surgical History:  Procedure Laterality Date  . CATARACT EXTRACTION      Family History  Problem Relation Age of Onset  . Colon cancer Son        died age 37  . Diabetes Unknown   . Hypertension Unknown   . Colon cancer Daughter     Social History:  reports that he quit smoking about 40 years ago. His smoking use included cigarettes. He has a 9.50 pack-year smoking history. He has never used smokeless tobacco. He reports that he does not drink alcohol or use drugs.  Allergies: No Known Allergies  Medications: I have reviewed the patient's current medications.  Results for orders placed or performed during the hospital encounter of 07/25/17 (from the past 48 hour(s))  Comprehensive metabolic panel     Status: Abnormal   Collection Time: 07/25/17 12:04 PM  Result Value Ref Range   Sodium 146 (H) 135 - 145 mmol/L   Potassium 4.8 3.5 - 5.1 mmol/L   Chloride 107 101 - 111  mmol/L   CO2 26 22 - 32 mmol/L   Glucose, Bld 126 (H) 65 - 99 mg/dL   BUN 56 (H) 6 - 20 mg/dL   Creatinine, Ser 2.52 (H) 0.61 - 1.24 mg/dL   Calcium 10.1 8.9 - 10.3 mg/dL   Total Protein 7.8 6.5 - 8.1 g/dL   Albumin 2.9 (L) 3.5 - 5.0 g/dL   AST 142 (H) 15 - 41 U/L   ALT 152 (H) 17 - 63 U/L   Alkaline Phosphatase 288 (H) 38 - 126 U/L   Total Bilirubin 3.2 (H) 0.3 - 1.2 mg/dL   GFR calc non Af Amer 20 (L) >60 mL/min   GFR calc Af Amer 24 (L) >60 mL/min    Comment: (NOTE) The eGFR has been calculated using the CKD EPI equation. This calculation has not been validated in all clinical situations. eGFR's persistently <60 mL/min signify possible Chronic Kidney Disease.    Anion gap 13 5 - 15    Comment: Performed at [facility], [place] [address] [place],  [address]  CBC with Differential     Status: Abnormal   Collection Time: 07/25/17 12:04 PM  Result Value Ref Range   WBC 13.0 (H) 4.0 - 10.5 K/uL   RBC 5.04 4.22 - 5.81 MIL/uL   Hemoglobin 14.2 13.0 - 17.0  g/dL   HCT 43.0 39.0 - 52.0 %   MCV 85.3 78.0 - 100.0 fL   MCH 28.2 26.0 - 34.0 pg   MCHC 33.0 30.0 - 36.0 g/dL   RDW 13.5 11.5 - 15.5 %   Platelets 854 (H) 150 - 400 K/uL   Neutrophils Relative % 82 %   Neutro Abs 10.7 (H) 1.7 - 7.7 K/uL   Lymphocytes Relative 8 %   Lymphs Abs 1.0 0.7 - 4.0 K/uL   Monocytes Relative 10 %   Monocytes Absolute 1.2 (H) 0.1 - 1.0 K/uL   Eosinophils Relative 0 %   Eosinophils Absolute 0.0 0.0 - 0.7 K/uL   Basophils Relative 0 %   Basophils Absolute 0.0 0.0 - 0.1 K/uL    Comment: Performed at [facility], [place] [address] [place], [place] [address]  Urinalysis, Routine w reflex microscopic     Status: Abnormal   Collection Time: 07/25/17  3:40 PM  Result Value Ref Range   Color, Urine AMBER (A) YELLOW    Comment: BIOCHEMICALS MAY BE AFFECTED BY COLOR   APPearance CLOUDY (A) CLEAR   Specific Gravity, Urine 1.021 1.005 - 1.030   pH 5.0 5.0 - 8.0    Glucose, UA NEGATIVE NEGATIVE mg/dL   Hgb urine dipstick MODERATE (A) NEGATIVE   Bilirubin Urine MODERATE (A) NEGATIVE   Ketones, ur 5 (A) NEGATIVE mg/dL   Protein, ur 100 (A) NEGATIVE mg/dL   Nitrite POSITIVE (A) NEGATIVE   Leukocytes, UA NEGATIVE NEGATIVE   RBC / HPF 6-10 0 - 5 RBC/hpf   WBC, UA 6-10 0 - 5 WBC/hpf   Bacteria, UA FEW (A) NONE SEEN   Squamous Epithelial / LPF 0-5 0 - 5   Mucus PRESENT    Hyaline Casts, UA PRESENT    Amorphous Crystal PRESENT     Comment: Performed at [facility], [place] [address] [place], [place] [address]  Uric acid     Status: Abnormal   Collection Time: 07/25/17 11:42 PM  Result Value Ref Range   Uric Acid, Serum 11.4 (H) 4.4 - 7.6 mg/dL    Comment: Performed at [facility], [place] [address], [place], [place] [address]  Troponin I     Status: Abnormal   Collection Time: 07/25/17 11:42 PM  Result Value Ref Range   Troponin I 0.06 (HH) <0.03 ng/mL    Comment: CRITICAL RESULT CALLED TO, READ BACK BY AND VERIFIED WITH: PENG AT 0035 ON 07/26/2017 BY MOSLEY,J Performed at [facility], [place] [address] [place], [place] [address]   Glucose, capillary     Status: Abnormal   Collection Time: 07/26/17 12:19 AM  Result Value Ref Range   Glucose-Capillary 102 (H) 65 - 99 mg/dL  Basic metabolic panel     Status: Abnormal   Collection Time: 07/26/17  4:23 AM  Result Value Ref Range   Sodium 145 135 - 145 mmol/L   Potassium 4.2 3.5 - 5.1 mmol/L   Chloride 111 101 - 111 mmol/L   CO2 20 (L) 22 - 32 mmol/L   Glucose, Bld 109 (H) 65 - 99 mg/dL   BUN 55 (H) 6 - 20 mg/dL   Creatinine, Ser 2.04 (H) 0.61 - 1.24 mg/dL   Calcium 9.5 8.9 - 10.3 mg/dL   GFR calc non Af Amer 26 (L) >60 mL/min   GFR calc Af Amer 30 (L) >60 mL/min    Comment: (NOTE) The eGFR has been calculated using  the CKD EPI equation. This calculation has not been validated in all clinical situations. eGFR's persistently <60 mL/min signify  possible Chronic Kidney Disease.    Anion gap 14 5 - 15    Comment: Performed at [facility], [place] [address] [place], [place] [address]  Hepatic function panel     Status: Abnormal   Collection Time: 07/26/17  4:23 AM  Result Value Ref Range   Total Protein 7.0 6.5 - 8.1 g/dL   Albumin 2.7 (L) 3.5 - 5.0 g/dL   AST 129 (H) 15 - 41 U/L   ALT 139 (H) 17 - 63 U/L   Alkaline Phosphatase 283 (H) 38 - 126 U/L   Total Bilirubin 3.1 (H) 0.3 - 1.2 mg/dL   Bilirubin, Direct 1.9 (H) 0.1 - 0.5 mg/dL   Indirect Bilirubin 1.2 (H) 0.3 - 0.9 mg/dL    Comment: Performed at [facility], [place] [address], [place], [place] [address]  CBC WITH DIFFERENTIAL     Status: Abnormal   Collection Time: 07/26/17  4:23 AM  Result Value Ref Range   WBC 15.6 (H) 4.0 - 10.5 K/uL   RBC 5.29 4.22 - 5.81 MIL/uL   Hemoglobin 14.5 13.0 - 17.0 g/dL   HCT 44.1 39.0 - 52.0 %   MCV 83.4 78.0 - 100.0 fL   MCH 27.4 26.0 - 34.0 pg   MCHC 32.9 30.0 - 36.0 g/dL   RDW 13.5 11.5 - 15.5 %   Platelets 762 (H) 150 - 400 K/uL   Neutrophils Relative % 83 %   Neutro Abs 12.9 (H) 1.7 - 7.7 K/uL   Lymphocytes Relative 12 %   Lymphs Abs 1.9 0.7 - 4.0 K/uL   Monocytes Relative 5 %   Monocytes Absolute 0.8 0.1 - 1.0 K/uL   Eosinophils Relative 0 %   Eosinophils Absolute 0.0 0.0 - 0.7 K/uL   Basophils Relative 0 %   Basophils Absolute 0.0 0.0 - 0.1 K/uL    Comment: Performed at [facility], [place] [address], [place], [place] [address]  Troponin I (q 6hr x 3)     Status: Abnormal   Collection Time: 07/26/17  6:30 AM  Result Value Ref Range   Troponin I 0.09 (HH) <0.03 ng/mL    Comment: CRITICAL VALUE NOTED.  VALUE IS CONSISTENT WITH PREVIOUSLY REPORTED AND CALLED VALUE. Performed at [facility], [place] [address] [place], [place] [address]   Glucose, capillary     Status: None   Collection Time: 07/26/17  6:42 AM  Result Value Ref Range    Glucose-Capillary 99 65 - 99 mg/dL    US Abdomen Complete  Result Date: 07/25/2017 CLINICAL DATA:  Acute renal failure. Abdominal pain. Vomiting. Anorexia. EXAM: ABDOMEN ULTRASOUND COMPLETE COMPARISON:  None. FINDINGS: Gallbladder: There is a 1.7 cm calcified shadowing gallstone in the contracted gallbladder. Apparent mild diffuse gallbladder wall thickening. No pericholecystic fluid. No sonographic Murphy's sign. Common bile duct: Diameter: 8 mm, mildly dilated. No choledocholithiasis demonstrated, noting nonvisualization of the distal common bile duct due to overlying bowel gas. Liver: Liver parenchyma is mildly heterogeneous, with normal echogenicity. No liver mass. Portal vein is patent on color Doppler imaging with normal direction of blood flow towards the liver. IVC: No abnormality visualized. Pancreas: Visualized portion unremarkable, with top-normal main pancreatic duct. Spleen: Size and appearance within normal limits. Right Kidney: Length: 9.7 cm. Echogenic right renal parenchyma. No right hydronephrosis. No right renal mass. Left Kidney: Length: 10.1  cm. Echogenic left renal parenchyma. No left hydronephrosis. No left renal mass. Abdominal aorta: Atherosclerotic abdominal aorta with ectatic 2.7 cm infrarenal abdominal aorta. Other findings: Small volume upper abdominal ascites. IMPRESSION: 1. Cholelithiasis. Contracted gallbladder. Apparent mild diffuse gallbladder wall thickening. No pericholecystic fluid. No sonographic Murphy's sign. Sonographic findings are most compatible with chronic calculous cholecystitis. 2. Mildly dilated common bile duct (8 mm diameter). No choledocholithiasis demonstrated, noting nonvisualization of the distal CBD due to overlying bowel gas. Recommend correlation with serum bilirubin levels. Consider MRI abdomen with MRCP without and with IV contrast for further evaluation, as clinically warranted. 3. Nonspecific mild liver parenchymal heterogeneity, cannot exclude  hepatocellular disease. No liver mass. 4. Echogenic kidneys, indicative of nonspecific renal parenchymal disease of uncertain chronicity. No hydronephrosis. 5. Ectatic 2.7 cm infrarenal abdominal aorta, at risk for aneurysm development. Recommend follow-up aortic ultrasound in 5 years. This recommendation follows ACR consensus guidelines: White Paper of the ACR Incidental Findings Committee II on Vascular Findings. J Am Coll Radiol 2013; 10:789-794. Electronically Signed   By: Ilona Sorrel M.D.   On: 07/25/2017 19:45   Dg Chest Port 1 View  Result Date: 07/26/2017 CLINICAL DATA:  Nausea and vomiting last night. EXAM: PORTABLE CHEST 1 VIEW COMPARISON:  Chest x-ray dated 06/17/2006. FINDINGS: Study is slightly hypoinspiratory with crowding of the bibasilar bronchovascular markings. Given the slightly low lung volumes, lungs are clear. No confluent opacity to suggest a developing pneumonia. No pleural effusion or pneumothorax seen. Heart size and mediastinal contours are stable. Atherosclerotic changes noted at the aortic arch. No acute or suspicious osseous finding. IMPRESSION: 1.  No active disease.  No evidence of pneumonia or pulmonary edema. 2.   Aortic atherosclerosis. Electronically Signed   By: Franki Cabot M.D.   On: 07/26/2017 07:19   Dg Hand Complete Right  Result Date: 07/25/2017 CLINICAL DATA:  Hand pain and weakness, no known injury, initial encounter EXAM: RIGHT HAND - COMPLETE 3+ VIEW COMPARISON:  None. FINDINGS: Examination is somewhat limited secondary to patient's inability to straighten is fingers. Mild osteopenia is seen. No definitive acute fracture is seen. Diffuse vascular calcifications are noted. IMPRESSION: No acute abnormality noted. Electronically Signed   By: Inez Catalina M.D.   On: 07/25/2017 16:56    Review of Systems  Unable to perform ROS: Age   Blood pressure (!) 148/72, pulse 95, temperature 98.1 F (36.7 C), temperature source Oral, resp. rate 20, height '5\' 6"'$  (1.676  m), weight 55.4 kg (122 lb 2.2 oz), SpO2 100 %. Physical Exam  Constitutional: He appears lethargic. He appears cachectic. He has a sickly appearance.  HENT:  Head: Normocephalic and atraumatic.  Eyes: Pupils are equal, round, and reactive to light. No scleral icterus.  Neck: Normal range of motion. Neck supple.  Cardiovascular: Normal rate and regular rhythm.  Respiratory: Effort normal and breath sounds normal.  GI: Soft. He exhibits no distension and no mass. There is no tenderness. There is no rebound and no guarding.  Neurological: He appears lethargic. He is disoriented. GCS eye subscore is 4. GCS verbal subscore is 5. GCS motor subscore is 6.  Skin: Skin is warm and dry.  Psychiatric: He has a normal mood and affect.    Assessment/Plan: Gallstones with signs of chronic cholecystitis without acute exacerbation  Elevated liver function studies  Advanced age  Dementia  Elevated troponin  Agree with MRCP once done.  Patient does not appear to be a good operative candidate and has no signs of acute cholecystitis.  He could have retained common bile duct stones leading to potential ascending cholangitis which could worsen his mental status and elevated his white count.  GI consultation if MRCP positive for common bile duct stones.  Cholecystectomy possible but given advanced age in no acute signs may be best to clear duct if that is an issue and follow.  No family at bedside to discuss.  Will follow along.  Merla Sawka A D[place]ik [place] 07/26/2017, 8:38 AM

## 2017-07-26 NOTE — Progress Notes (Signed)
PROGRESS NOTE    Mathew Waller   RDE:[id number]  DOB: [birth year]  DOA: 07/25/2017 PCP: [name], MD (Inactive)   Brief Narrative:  Mathew Waller  a 84 y.o. male with history of dementia and hypertension presents to the ER with complaints of persistent nausea vomiting over the last 4 days. History obtained from son in ED.   He was uable to take his medications due to the vomiting and also had increased pain in the right hand with difficult to make fist  LFTS noted to be elevated, Ultrasound revealed gallstones and CBD dilated to 8 mm. BP was as high as 205/86   Subjective: Does not appear to be in any pain. Confused.     Assessment & Plan:   Principal Problem:   Nausea & vomiting with elevated LFTs and CBD dilatation and  Cholelithiasis Leukocytosis - awaiting MRCP - he does not seem to have pain, vomiting or fevers in hospital-  - on Zosyn - start clear liquids and follow for symptoms  Active Problems:   ARF (acute renal failure) vs CKD - likely prerenal- last Cr in 2013 was 1.4- has been hydrated - Cr 2.52>> 2.04    Hypertensive urgency - due to inability to take Norvasc, Hydralazine and Toprol - currently on Hydralazine 10 mg QID, Lopressor 5mg  Q8hr- change Lopressor to Q6 hrs - BP improved  Elevated troponin - due to HTN-   Right hand pain? - I have palpated his hand today and he has not pain- no swelling either - Uric acid is 11.4 - xray done in ED unrevealing   DVT prophylaxis: SCDs Code Status: Full code Family Communication:  Disposition Plan: follow LFTs and symptoms Consultants:   gen surgery Procedures:    Antimicrobials:  Anti-infectives (From admission, onward)   Start     Dose/Rate Route Frequency Ordered Stop   07/25/17 2200  piperacillin-tazobactam (ZOSYN) IVPB 2.25 g     2.25 g 100 mL/hr over 30 Minutes Intravenous Every 8 hours 07/25/17 2107     07/25/17 2030  cefTRIAXone (ROCEPHIN) 1 g in sodium chloride 0.9 % 100 mL IVPB   Status:  Discontinued     1 g 200 mL/hr over 30 Minutes Intravenous Every 24 hours 07/25/17 2024 07/25/17 2045       Objective: Vitals:   07/26/17 0101 07/26/17 0501 07/26/17 0814 07/26/17 1252  BP: 133/64 (!) 198/86 (!) 148/72 (!) 172/92  Pulse: 98 95    Resp:  20    Temp:  98.1 F (36.7 C)    TempSrc:  Oral    SpO2:  100%    Weight:      Height:        Intake/Output Summary (Last 24 hours) at 07/26/2017 1258 Last data filed at 07/26/2017 1000 Gross per 24 hour  Intake 907.5 ml  Output 200 ml  Net 707.5 ml   Filed Weights   07/25/17 1140 07/25/17 2300  Weight: 67.1 kg (148 lb) 55.4 kg (122 lb 2.2 oz)    Examination: General exam: Appears comfortable  HEENT: PERRLA, oral mucosa moist, no sclera icterus or thrush Respiratory system: Clear to auscultation. Respiratory effort normal. Cardiovascular system: S1 & S2 heard, RRR.   Gastrointestinal system: Abdomen soft, non-tender, nondistended. Normal bowel sound. No organomegaly Central nervous system: Alert - confused x to time, place and situation- No focal neurological deficits. Extremities: No cyanosis, clubbing or edema Skin: No rashes or ulcers Psychiatry:  Mood & affect appropriate.     Data  Reviewed: I have personally reviewed following labs and imaging studies  CBC: Recent Labs  Lab 07/25/17 1204 07/26/17 0423  WBC 13.0* 15.6*  NEUTROABS 10.7* 12.9*  HGB 14.2 14.5  HCT 43.0 44.1  MCV 85.3 83.4  PLT 854* 161*   Basic Metabolic Panel: Recent Labs  Lab 07/25/17 1204 07/26/17 0423  NA 146* 145  K 4.8 4.2  CL 107 111  CO2 26 20*  GLUCOSE 126* 109*  BUN 56* 55*  CREATININE 2.52* 2.04*  CALCIUM 10.1 9.5   GFR: Estimated Creatinine Clearance: 17.4 mL/min (A) (by C-G formula based on SCr of 2.04 mg/dL (H)). Liver Function Tests: Recent Labs  Lab 07/25/17 1204 07/26/17 0423  AST 142* 129*  ALT 152* 139*  ALKPHOS 288* 283*  BILITOT 3.2* 3.1*  PROT 7.8 7.0  ALBUMIN 2.9* 2.7*   No results for  input(s): LIPASE, AMYLASE in the last 168 hours. No results for input(s): AMMONIA in the last 168 hours. Coagulation Profile: No results for input(s): INR, PROTIME in the last 168 hours. Cardiac Enzymes: Recent Labs  Lab 07/25/17 2342 07/26/17 0630  TROPONINI 0.06* 0.09*   BNP (last 3 results) No results for input(s): PROBNP in the last 8760 hours. HbA1C: No results for input(s): HGBA1C in the last 72 hours. CBG: Recent Labs  Lab 07/26/17 0019 07/26/17 0642 07/26/17 1244  GLUCAP 102* 99 110*   Lipid Profile: No results for input(s): CHOL, HDL, LDLCALC, TRIG, CHOLHDL, LDLDIRECT in the last 72 hours. Thyroid Function Tests: No results for input(s): TSH, T4TOTAL, FREET4, T3FREE, THYROIDAB in the last 72 hours. Anemia Panel: No results for input(s): VITAMINB12, FOLATE, FERRITIN, TIBC, IRON, RETICCTPCT in the last 72 hours. Urine analysis:    Component Value Date/Time   COLORURINE AMBER (A) 07/25/2017 1540   APPEARANCEUR CLOUDY (A) 07/25/2017 1540   LABSPEC 1.021 07/25/2017 1540   PHURINE 5.0 07/25/2017 1540   GLUCOSEU NEGATIVE 07/25/2017 1540   HGBUR MODERATE (A) 07/25/2017 1540   BILIRUBINUR MODERATE (A) 07/25/2017 1540   KETONESUR 5 (A) 07/25/2017 1540   PROTEINUR 100 (A) 07/25/2017 1540   NITRITE POSITIVE (A) 07/25/2017 1540   LEUKOCYTESUR NEGATIVE 07/25/2017 1540   Sepsis Labs: @LABRCNTIP (procalcitonin:4,lacticidven:4) )No results found for this or any previous visit (from the past 240 hour(s)).       Radiology Studies: US Abdomen Complete  Result Date: 07/25/2017 CLINICAL DATA:  Acute renal failure. Abdominal pain. Vomiting. Anorexia. EXAM: ABDOMEN ULTRASOUND COMPLETE COMPARISON:  None. FINDINGS: Gallbladder: There is a 1.7 cm calcified shadowing gallstone in the contracted gallbladder. Apparent mild diffuse gallbladder wall thickening. No pericholecystic fluid. No sonographic Murphy's sign. Common bile duct: Diameter: 8 mm, mildly dilated. No  choledocholithiasis demonstrated, noting nonvisualization of the distal common bile duct due to overlying bowel gas. Liver: Liver parenchyma is mildly heterogeneous, with normal echogenicity. No liver mass. Portal vein is patent on color Doppler imaging with normal direction of blood flow towards the liver. IVC: No abnormality visualized. Pancreas: Visualized portion unremarkable, with top-normal main pancreatic duct. Spleen: Size and appearance within normal limits. Right Kidney: Length: 9.7 cm. Echogenic right renal parenchyma. No right hydronephrosis. No right renal mass. Left Kidney: Length: 10.1 cm. Echogenic left renal parenchyma. No left hydronephrosis. No left renal mass. Abdominal aorta: Atherosclerotic abdominal aorta with ectatic 2.7 cm infrarenal abdominal aorta. Other findings: Small volume upper abdominal ascites. IMPRESSION: 1. Cholelithiasis. Contracted gallbladder. Apparent mild diffuse gallbladder wall thickening. No pericholecystic fluid. No sonographic Murphy's sign. Sonographic findings are most compatible with chronic calculous  cholecystitis. 2. Mildly dilated common bile duct (8 mm diameter). No choledocholithiasis demonstrated, noting nonvisualization of the distal CBD due to overlying bowel gas. Recommend correlation with serum bilirubin levels. Consider MRI abdomen with MRCP without and with IV contrast for further evaluation, as clinically warranted. 3. Nonspecific mild liver parenchymal heterogeneity, cannot exclude hepatocellular disease. No liver mass. 4. Echogenic kidneys, indicative of nonspecific renal parenchymal disease of uncertain chronicity. No hydronephrosis. 5. Ectatic 2.7 cm infrarenal abdominal aorta, at risk for aneurysm development. Recommend follow-up aortic ultrasound in 5 years. This recommendation follows ACR consensus guidelines: White Paper of the ACR Incidental Findings Committee II on Vascular Findings. J Am Coll Radiol 2013; 10:789-794. Electronically Signed   By:  Ilona Sorrel M.D.   On: 07/25/2017 19:45   Dg Chest Port 1 View  Result Date: 07/26/2017 CLINICAL DATA:  Nausea and vomiting last night. EXAM: PORTABLE CHEST 1 VIEW COMPARISON:  Chest x-ray dated 06/17/2006. FINDINGS: Study is slightly hypoinspiratory with crowding of the bibasilar bronchovascular markings. Given the slightly low lung volumes, lungs are clear. No confluent opacity to suggest a developing pneumonia. No pleural effusion or pneumothorax seen. Heart size and mediastinal contours are stable. Atherosclerotic changes noted at the aortic arch. No acute or suspicious osseous finding. IMPRESSION: 1.  No active disease.  No evidence of pneumonia or pulmonary edema. 2.   Aortic atherosclerosis. Electronically Signed   By: Franki Cabot M.D.   On: 07/26/2017 07:19   Dg Hand Complete Right  Result Date: 07/25/2017 CLINICAL DATA:  Hand pain and weakness, no known injury, initial encounter EXAM: RIGHT HAND - COMPLETE 3+ VIEW COMPARISON:  None. FINDINGS: Examination is somewhat limited secondary to patient's inability to straighten is fingers. Mild osteopenia is seen. No definitive acute fracture is seen. Diffuse vascular calcifications are noted. IMPRESSION: No acute abnormality noted. Electronically Signed   By: Inez Catalina M.D.   On: 07/25/2017 16:56      Scheduled Meds: . hydrALAZINE  10 mg Intravenous Q6H  . metoprolol tartrate  5 mg Intravenous Q8H   Continuous Infusions: . sodium chloride 75 mL/hr at 07/25/17 2314  . piperacillin-tazobactam (ZOSYN)  IV Stopped (07/26/17 0617)     LOS: 1 day    Time spent in minutes: 35    Debbe Odea, MD [facility] Pager: [email or web address] Password [facility] 07/26/2017, 12:58 PM

## 2017-07-26 NOTE — Plan of Care (Signed)
  Problem: Safety: Goal: Ability to remain free from injury will improve Outcome: Progressing   

## 2017-07-26 NOTE — Progress Notes (Signed)
CRITICAL VALUE ALERT  Critical Value:  Troponin 0.06  Date & Time Notied:  07/26/17 0049  Provider Notified: Kennon Holter  Orders Received/Actions taken: none

## 2017-07-27 ENCOUNTER — Inpatient Hospital Stay (HOSPITAL_COMMUNITY): Payer: Medicare HMO

## 2017-07-27 DIAGNOSIS — E43 Unspecified severe protein-calorie malnutrition: Secondary | ICD-10-CM

## 2017-07-27 LAB — COMPREHENSIVE METABOLIC PANEL
ALBUMIN: 2.5 g/dL — AB (ref 3.5–5.0)
ALK PHOS: 314 U/L — AB (ref 38–126)
ALT: 143 U/L — AB (ref 17–63)
ALT: 139 U/L — ABNORMAL HIGH (ref 17–63)
AST: 132 U/L — ABNORMAL HIGH (ref 15–41)
AST: 121 U/L — AB (ref 15–41)
Albumin: 2.7 g/dL — ABNORMAL LOW (ref 3.5–5.0)
Alkaline Phosphatase: 292 U/L — ABNORMAL HIGH (ref 38–126)
Anion gap: 14 (ref 5–15)
Anion gap: 14 (ref 5–15)
BILIRUBIN TOTAL: 3.9 mg/dL — AB (ref 0.3–1.2)
BUN: 60 mg/dL — ABNORMAL HIGH (ref 6–20)
BUN: 63 mg/dL — AB (ref 6–20)
CALCIUM: 9.6 mg/dL (ref 8.9–10.3)
CHLORIDE: 116 mmol/L — AB (ref 101–111)
CO2: 20 mmol/L — ABNORMAL LOW (ref 22–32)
CO2: 20 mmol/L — ABNORMAL LOW (ref 22–32)
CREATININE: 2.12 mg/dL — AB (ref 0.61–1.24)
Calcium: 9.5 mg/dL (ref 8.9–10.3)
Chloride: 114 mmol/L — ABNORMAL HIGH (ref 101–111)
Creatinine, Ser: 2.24 mg/dL — ABNORMAL HIGH (ref 0.61–1.24)
GFR calc Af Amer: 27 mL/min — ABNORMAL LOW (ref >60)
GFR calc non Af Amer: 23 mL/min — ABNORMAL LOW (ref >60)
GFR, EST AFRICAN AMERICAN: 29 mL/min — AB (ref >60)
GFR, EST NON AFRICAN AMERICAN: 25 mL/min — AB (ref >60)
Glucose, Bld: 110 mg/dL — ABNORMAL HIGH (ref 65–99)
Glucose, Bld: 110 mg/dL — ABNORMAL HIGH (ref 65–99)
POTASSIUM: 4.3 mmol/L (ref 3.5–5.1)
Potassium: 5.1 mmol/L (ref 3.5–5.1)
Sodium: 148 mmol/L — ABNORMAL HIGH (ref 135–145)
Sodium: 150 mmol/L — ABNORMAL HIGH (ref 135–145)
Total Bilirubin: 3.3 mg/dL — ABNORMAL HIGH (ref 0.3–1.2)
Total Protein: 6.6 g/dL (ref 6.5–8.1)
Total Protein: 6.5 g/dL (ref 6.5–8.1)

## 2017-07-27 LAB — GLUCOSE, CAPILLARY
GLUCOSE-CAPILLARY: 95 mg/dL (ref 65–99)
Glucose-Capillary: 97 mg/dL (ref 65–99)
Glucose-Capillary: 102 mg/dL — ABNORMAL HIGH (ref 65–99)

## 2017-07-27 MED ORDER — SODIUM CHLORIDE 0.9 % IV SOLN: INTRAVENOUS | Status: DC

## 2017-07-27 MED ORDER — HALOPERIDOL LACTATE 5 MG/ML IJ SOLN: 2.0000 mg | Freq: Once | INTRAMUSCULAR | Status: DC | PRN

## 2017-07-27 MED ORDER — LORAZEPAM 2 MG/ML IJ SOLN: 0.5000 mg | Freq: Once | INTRAMUSCULAR | Status: DC

## 2017-07-27 MED ORDER — SODIUM BICARBONATE 8.4 % IV SOLN
INTRAVENOUS | Status: DC
  Administered 2017-07-27 – 2017-07-29 (×5): via INTRAVENOUS
  Filled 2017-07-27 (×5): qty 100

## 2017-07-27 NOTE — Progress Notes (Signed)
Patient unable to complete MRCP. Patient would not tolerate procedure. Per MRI if patient were to have a sedative he would not be able to follow commands, due to patient already being drowsy. Updated family at bedside. Will continue to follow POC.

## 2017-07-27 NOTE — Progress Notes (Signed)
PROGRESS NOTE    Mathew Waller   OZD:[id number]  DOB: 06-09-1921  DOA: 07/25/2017 PCP: [name], MD (Inactive)   Brief Narrative:  Mathew Waller  a 84 y.o. male with history of dementia and hypertension presents to the ER with complaints of persistent nausea vomiting over the last 4 days. History obtained from son in ED.   He was uable to take his medications due to the vomiting and also had increased pain in the right hand with difficult to make fist  LFTS noted to be elevated, Ultrasound revealed gallstones and CBD dilated to 8 mm. BP was as high as 205/86   Subjective: Non verbal. Per RN, he has not had any issues overnight.    Assessment & Plan:   Principal Problem:   Nausea & vomiting with elevated LFTs and CBD dilatation and  Cholelithiasis Leukocytosis - awaiting MRCP still- will need a sedative - he does not seem to have pain, vomiting or fevers in hospital-  - on Zosyn - start clear liquids and follow for symptoms  Active Problems:   ARF (acute renal failure) vs CKD - likely prerenal- last Cr in 2013 was 1.4- has been hydrated - Cr 2.52>> 2.04> 2.12- cont IVF- change to D5 with 2 amp Bicarb due to acidosis  Dementia? - mostly non-verbal - follows some commands  Acute bronchitis - coughed up yellow mucous today- CXR on admission negative    Hypertensive urgency - due to inability to take Norvasc, Hydralazine and Toprol at home (vomiting) - currently on Hydralazine 10 mg QID, Lopressor 5mg  Q6 hrs - BP improved- 136/62 today- check vitals Q4.   Elevated troponin - due to HTN-   Right hand pain? - 6/2- no pain & no swelling in hand - Uric acid is 11.4 - xray done in ED unrevealing   DVT prophylaxis: SCDs Code Status: Full code Family Communication:  Disposition Plan: follow LFTs and symptoms Consultants:   gen surgery Procedures:    Antimicrobials:  Anti-infectives (From admission, onward)   Start     Dose/Rate Route Frequency Ordered  Stop   07/25/17 2200  piperacillin-tazobactam (ZOSYN) IVPB 2.25 g     2.25 g 100 mL/hr over 30 Minutes Intravenous Every 8 hours 07/25/17 2107     07/25/17 2030  cefTRIAXone (ROCEPHIN) 1 g in sodium chloride 0.9 % 100 mL IVPB  Status:  Discontinued     1 g 200 mL/hr over 30 Minutes Intravenous Every 24 hours 07/25/17 2024 07/25/17 2045       Objective: Vitals:   07/26/17 2224 07/26/17 2337 07/27/17 0501 07/27/17 0611  BP: (!) 198/93 139/73 (!) 201/108 136/62  Pulse: (!) 111 92 (!) 116 84  Resp: 19  16   Temp: 97.7 F (36.5 C)  98.1 F (36.7 C)   TempSrc: Oral  Oral   SpO2: 99%  100%   Weight:      Height:        Intake/Output Summary (Last 24 hours) at 07/27/2017 1446 Last data filed at 07/27/2017 0536 Gross per 24 hour  Intake -  Output 325 ml  Net -325 ml   Filed Weights   07/25/17 1140 07/25/17 2300  Weight: 67.1 kg (148 lb) 55.4 kg (122 lb 2.2 oz)    Examination: General exam: Appears comfortable  HEENT: PERRLA, oral mucosa moist, no sclera icterus or thrush Respiratory system: Clear to auscultation. Respiratory effort normal. Cardiovascular system: S1 & S2 heard, RRR.   Gastrointestinal system: Abdomen soft, non-tender, nondistended.  Normal bowel sound. No organomegaly Central nervous system: Alert - nonverbal today- No focal neurological deficits. Extremities: No cyanosis, clubbing or edema Skin: No rashes or ulcers    Data Reviewed: I have personally reviewed following labs and imaging studies  CBC: Recent Labs  Lab 07/25/17 1204 07/26/17 0423  WBC 13.0* 15.6*  NEUTROABS 10.7* 12.9*  HGB 14.2 14.5  HCT 43.0 44.1  MCV 85.3 83.4  PLT 854* 454*   Basic Metabolic Panel: Recent Labs  Lab 07/25/17 1204 07/26/17 0423 07/27/17 0818  NA 146* 145 148*  K 4.8 4.2 5.1  CL 107 111 114*  CO2 26 20* 20*  GLUCOSE 126* 109* 110*  BUN 56* 55* 60*  CREATININE 2.52* 2.04* 2.12*  CALCIUM 10.1 9.5 9.6   GFR: Estimated Creatinine Clearance: 16.7 mL/min (A)  (by C-G formula based on SCr of 2.12 mg/dL (H)). Liver Function Tests: Recent Labs  Lab 07/25/17 1204 07/26/17 0423 07/27/17 0818  AST 142* 129* 132*  ALT 152* 139* 143*  ALKPHOS 288* 283* 314*  BILITOT 3.2* 3.1* 3.9*  PROT 7.8 7.0 6.6  ALBUMIN 2.9* 2.7* 2.7*   No results for input(s): LIPASE, AMYLASE in the last 168 hours. No results for input(s): AMMONIA in the last 168 hours. Coagulation Profile: No results for input(s): INR, PROTIME in the last 168 hours. Cardiac Enzymes: Recent Labs  Lab 07/25/17 2342 07/26/17 0630  TROPONINI 0.06* 0.09*   BNP (last 3 results) No results for input(s): PROBNP in the last 8760 hours. HbA1C: No results for input(s): HGBA1C in the last 72 hours. CBG: Recent Labs  Lab 07/26/17 1244 07/26/17 1809 07/26/17 2350 07/27/17 0556 07/27/17 1132  GLUCAP 110* 107* 111* 97 102*   Lipid Profile: No results for input(s): CHOL, HDL, LDLCALC, TRIG, CHOLHDL, LDLDIRECT in the last 72 hours. Thyroid Function Tests: No results for input(s): TSH, T4TOTAL, FREET4, T3FREE, THYROIDAB in the last 72 hours. Anemia Panel: No results for input(s): VITAMINB12, FOLATE, FERRITIN, TIBC, IRON, RETICCTPCT in the last 72 hours. Urine analysis:    Component Value Date/Time   COLORURINE AMBER (A) 07/25/2017 1540   APPEARANCEUR CLOUDY (A) 07/25/2017 1540   LABSPEC 1.021 07/25/2017 1540   PHURINE 5.0 07/25/2017 1540   GLUCOSEU NEGATIVE 07/25/2017 1540   HGBUR MODERATE (A) 07/25/2017 1540   BILIRUBINUR MODERATE (A) 07/25/2017 1540   KETONESUR 5 (A) 07/25/2017 1540   PROTEINUR 100 (A) 07/25/2017 1540   NITRITE POSITIVE (A) 07/25/2017 1540   LEUKOCYTESUR NEGATIVE 07/25/2017 1540   Sepsis Labs: @LABRCNTIP (procalcitonin:4,lacticidven:4) )No results found for this or any previous visit (from the past 240 hour(s)).       Radiology Studies: US Abdomen Complete  Result Date: 07/25/2017 CLINICAL DATA:  Acute renal failure. Abdominal pain. Vomiting. Anorexia.  EXAM: ABDOMEN ULTRASOUND COMPLETE COMPARISON:  None. FINDINGS: Gallbladder: There is a 1.7 cm calcified shadowing gallstone in the contracted gallbladder. Apparent mild diffuse gallbladder wall thickening. No pericholecystic fluid. No sonographic Murphy's sign. Common bile duct: Diameter: 8 mm, mildly dilated. No choledocholithiasis demonstrated, noting nonvisualization of the distal common bile duct due to overlying bowel gas. Liver: Liver parenchyma is mildly heterogeneous, with normal echogenicity. No liver mass. Portal vein is patent on color Doppler imaging with normal direction of blood flow towards the liver. IVC: No abnormality visualized. Pancreas: Visualized portion unremarkable, with top-normal main pancreatic duct. Spleen: Size and appearance within normal limits. Right Kidney: Length: 9.7 cm. Echogenic right renal parenchyma. No right hydronephrosis. No right renal mass. Left Kidney: Length: 10.1 cm. Echogenic  left renal parenchyma. No left hydronephrosis. No left renal mass. Abdominal aorta: Atherosclerotic abdominal aorta with ectatic 2.7 cm infrarenal abdominal aorta. Other findings: Small volume upper abdominal ascites. IMPRESSION: 1. Cholelithiasis. Contracted gallbladder. Apparent mild diffuse gallbladder wall thickening. No pericholecystic fluid. No sonographic Murphy's sign. Sonographic findings are most compatible with chronic calculous cholecystitis. 2. Mildly dilated common bile duct (8 mm diameter). No choledocholithiasis demonstrated, noting nonvisualization of the distal CBD due to overlying bowel gas. Recommend correlation with serum bilirubin levels. Consider MRI abdomen with MRCP without and with IV contrast for further evaluation, as clinically warranted. 3. Nonspecific mild liver parenchymal heterogeneity, cannot exclude hepatocellular disease. No liver mass. 4. Echogenic kidneys, indicative of nonspecific renal parenchymal disease of uncertain chronicity. No hydronephrosis. 5.  Ectatic 2.7 cm infrarenal abdominal aorta, at risk for aneurysm development. Recommend follow-up aortic ultrasound in 5 years. This recommendation follows ACR consensus guidelines: White Paper of the ACR Incidental Findings Committee II on Vascular Findings. J Am Coll Radiol 2013; 10:789-794. Electronically Signed   By: Ilona Sorrel M.D.   On: 07/25/2017 19:45   Dg Chest Port 1 View  Result Date: 07/26/2017 CLINICAL DATA:  Nausea and vomiting last night. EXAM: PORTABLE CHEST 1 VIEW COMPARISON:  Chest x-ray dated 06/17/2006. FINDINGS: Study is slightly hypoinspiratory with crowding of the bibasilar bronchovascular markings. Given the slightly low lung volumes, lungs are clear. No confluent opacity to suggest a developing pneumonia. No pleural effusion or pneumothorax seen. Heart size and mediastinal contours are stable. Atherosclerotic changes noted at the aortic arch. No acute or suspicious osseous finding. IMPRESSION: 1.  No active disease.  No evidence of pneumonia or pulmonary edema. 2.   Aortic atherosclerosis. Electronically Signed   By: Franki Cabot M.D.   On: 07/26/2017 07:19   Dg Hand Complete Right  Result Date: 07/25/2017 CLINICAL DATA:  Hand pain and weakness, no known injury, initial encounter EXAM: RIGHT HAND - COMPLETE 3+ VIEW COMPARISON:  None. FINDINGS: Examination is somewhat limited secondary to patient's inability to straighten is fingers. Mild osteopenia is seen. No definitive acute fracture is seen. Diffuse vascular calcifications are noted. IMPRESSION: No acute abnormality noted. Electronically Signed   By: Inez Catalina M.D.   On: 07/25/2017 16:56      Scheduled Meds: . hydrALAZINE  10 mg Intravenous Q6H  . LORazepam  0.5 mg Intravenous Once  . metoprolol tartrate  5 mg Intravenous Q6H   Continuous Infusions: . piperacillin-tazobactam (ZOSYN)  IV 2.25 g (07/27/17 1317)  .  sodium bicarbonate  infusion 1000 mL 100 mL/hr at 07/27/17 1030     LOS: 2 days    Time spent in  minutes: 35    Debbe Odea, MD [facility] Pager: [email or web address] Password [email or web address] 07/27/2017, 2:46 PM

## 2017-07-27 NOTE — Progress Notes (Signed)
Initial Nutrition Assessment  DOCUMENTATION CODES:   Severe malnutrition in context of chronic illness  INTERVENTION:    Monitor for diet advancement/toleration  Ensure Enlive po BID, each supplement provides 350 kcal and 20 grams of protein once advanced  NUTRITION DIAGNOSIS:   Severe Malnutrition related to chronic illness(dementia/poor dentition) as evidenced by energy intake < or equal to 75% for > or equal to 1 month, percent weight loss, severe fat depletion, severe muscle depletion.  GOAL:   Patient will meet greater than or equal to 90% of their needs  MONITOR:   PO intake, Supplement acceptance, Diet advancement, Weight trends, Labs  REASON FOR ASSESSMENT:   Malnutrition Screening Tool    ASSESSMENT:   Patient with PMH significant for dementia, dyslipidemia, CKD III, and HTN. Presents this admission with complaints of nausea and vomiting for the last 4 days. Found to have elevated LFTs and cholelithiasis with features concerning for chronic cholecystitis with CBD dilation. Pt scheduled for MRCP 6/3.    Spoke with son at bedside. Reports pt's PO intake decreased to one spoonful of food and liquids one week ago due to recurring vomiting. Prior to this week intake was still in adequate related to chewing issues with poor dentition. Son states he was [set up to get dentures but refused at the last minute. Pt would consume soft foods like mashed potatoes, soups, and grits. He would drink two Ensure/day consistently. Son denies pt had any issues with swallowing. Pt is currently NPO for MRCP. Will provide supplementation once diet is advanced.    Margorie John endorses pt's UBW stayed around 135 lb and had a recent wt loss of 17 lb. Records indicate pt weighed 146 lb on 06/25/17 (at [facility]) and 122 lb this admission (16% wt loss in one month, significant for time frame). Nutrition-Focused physical exam completed.   Medications reviewed and include: D5 with sodium bicarbonate @ 100  ml/hr Labs reviewed: Na 148 (H) AST 132 (H) ALT 143 (H)   NUTRITION - FOCUSED PHYSICAL EXAM:    Most Recent Value  Orbital Region  Moderate depletion  Upper Arm Region  Severe depletion  Thoracic and Lumbar Region  Severe depletion  Buccal Region  Severe depletion  Temple Region  Severe depletion  Clavicle Bone Region  Severe depletion  Clavicle and Acromion Bone Region  Severe depletion  Scapular Bone Region  Severe depletion  Dorsal Hand  Mild depletion  Patellar Region  Severe depletion  Anterior Thigh Region  Severe depletion  Posterior Calf Region  Severe depletion  Edema (RD Assessment)  None  Hair  Reviewed  Eyes  Reviewed  Mouth  Reviewed  Skin  Reviewed  Nails  Reviewed     Diet Order:   Diet Order           Diet clear liquid Room service appropriate? Yes; Fluid consistency: Thin  Diet effective now          EDUCATION NEEDS:   Education needs have been addressed  Skin:  Skin Assessment: Reviewed RN Assessment  Last BM:  PTA  Height:   Ht Readings from Last 1 Encounters:  07/25/17 5\' 6"  (1.676 m)    Weight:   Wt Readings from Last 1 Encounters:  07/25/17 122 lb 2.2 oz (55.4 kg)    Ideal Body Weight:  64.5 kg  BMI:  Body mass index is 19.71 kg/m.  Estimated Nutritional Needs:   Kcal:  1700-1900 kcal  Protein:  85-95 g  Fluid:  >1.7 L/day  Mariana Single RD, LDN Clinical Nutrition Pager # [phone]

## 2017-07-27 NOTE — Evaluation (Signed)
SLP Cancellation Note  Patient Details Name: Mathew Waller MRN: [id number] DOB: [birth year]   Cancelled treatment:       Reason Eval/Treat Not Completed: Other (comment)(pt for MRCP, will continue efforts)   Macario Golds 07/27/2017, 1:02 PM   [name], [facility] SLP [phone]

## 2017-07-27 NOTE — Progress Notes (Signed)
Subjective/Chief Complaint: Pt with no acute changes awaiting MRCP   Objective: Vital signs in last 24 hours: Temp:  [97.7 F (36.5 C)-98.1 F (36.7 C)] 98.1 F (36.7 C) (06/03 0501) Pulse Rate:  [84-116] 84 (06/03 0611) Resp:  [16-19] 16 (06/03 0501) BP: (136-201)/(62-108) 136/62 (06/03 0611) SpO2:  [99 %-100 %] 100 % (06/03 0501)    Intake/Output from previous day: 06/02 0701 - 06/03 0700 In: 650 [I.V.:600; IV Piggyback:50] Out: 325 [Urine:325] Intake/Output this shift: No intake/output data recorded.  Constitutional: No acute distress, conversant, appears states age. Eyes: Anicteric sclerae, moist conjunctiva, no lid lag Lungs: Clear to auscultation bilaterally, normal respiratory effort CV: regular rate and rhythm, no murmurs, no peripheral edema, pedal pulses 2+ GI: Soft, no masses or hepatosplenomegaly, non-tender to palpation in RUQ Skin: No rashes, palpation reveals normal turgor Psychiatric: has dementia sxs   Lab Results:  Recent Labs    07/25/17 1204 07/26/17 0423  WBC 13.0* 15.6*  HGB 14.2 14.5  HCT 43.0 44.1  PLT 854* 762*   BMET Recent Labs    07/25/17 1204 07/26/17 0423  NA 146* 145  K 4.8 4.2  CL 107 111  CO2 26 20*  GLUCOSE 126* 109*  BUN 56* 55*  CREATININE 2.52* 2.04*  CALCIUM 10.1 9.5   PT/INR No results for input(s): LABPROT, INR in the last 72 hours. ABG No results for input(s): PHART, HCO3 in the last 72 hours.  Invalid input(s): PCO2, PO2  Studies/Results: US Abdomen Complete  Result Date: 07/25/2017 CLINICAL DATA:  Acute renal failure. Abdominal pain. Vomiting. Anorexia. EXAM: ABDOMEN ULTRASOUND COMPLETE COMPARISON:  None. FINDINGS: Gallbladder: There is a 1.7 cm calcified shadowing gallstone in the contracted gallbladder. Apparent mild diffuse gallbladder wall thickening. No pericholecystic fluid. No sonographic Murphy's sign. Common bile duct: Diameter: 8 mm, mildly dilated. No choledocholithiasis demonstrated, noting  nonvisualization of the distal common bile duct due to overlying bowel gas. Liver: Liver parenchyma is mildly heterogeneous, with normal echogenicity. No liver mass. Portal vein is patent on color Doppler imaging with normal direction of blood flow towards the liver. IVC: No abnormality visualized. Pancreas: Visualized portion unremarkable, with top-normal main pancreatic duct. Spleen: Size and appearance within normal limits. Right Kidney: Length: 9.7 cm. Echogenic right renal parenchyma. No right hydronephrosis. No right renal mass. Left Kidney: Length: 10.1 cm. Echogenic left renal parenchyma. No left hydronephrosis. No left renal mass. Abdominal aorta: Atherosclerotic abdominal aorta with ectatic 2.7 cm infrarenal abdominal aorta. Other findings: Small volume upper abdominal ascites. IMPRESSION: 1. Cholelithiasis. Contracted gallbladder. Apparent mild diffuse gallbladder wall thickening. No pericholecystic fluid. No sonographic Murphy's sign. Sonographic findings are most compatible with chronic calculous cholecystitis. 2. Mildly dilated common bile duct (8 mm diameter). No choledocholithiasis demonstrated, noting nonvisualization of the distal CBD due to overlying bowel gas. Recommend correlation with serum bilirubin levels. Consider MRI abdomen with MRCP without and with IV contrast for further evaluation, as clinically warranted. 3. Nonspecific mild liver parenchymal heterogeneity, cannot exclude hepatocellular disease. No liver mass. 4. Echogenic kidneys, indicative of nonspecific renal parenchymal disease of uncertain chronicity. No hydronephrosis. 5. Ectatic 2.7 cm infrarenal abdominal aorta, at risk for aneurysm development. Recommend follow-up aortic ultrasound in 5 years. This recommendation follows ACR consensus guidelines: White Paper of the ACR Incidental Findings Committee II on Vascular Findings. J Am Coll Radiol 2013; 10:789-794. Electronically Signed   By: Ilona Sorrel M.D.   On: 07/25/2017 19:45    Dg Chest Port 1 View  Result Date: 07/26/2017 CLINICAL  DATA:  Nausea and vomiting last night. EXAM: PORTABLE CHEST 1 VIEW COMPARISON:  Chest x-ray dated 06/17/2006. FINDINGS: Study is slightly hypoinspiratory with crowding of the bibasilar bronchovascular markings. Given the slightly low lung volumes, lungs are clear. No confluent opacity to suggest a developing pneumonia. No pleural effusion or pneumothorax seen. Heart size and mediastinal contours are stable. Atherosclerotic changes noted at the aortic arch. No acute or suspicious osseous finding. IMPRESSION: 1.  No active disease.  No evidence of pneumonia or pulmonary edema. 2.   Aortic atherosclerosis. Electronically Signed   By: Franki Cabot M.D.   On: 07/26/2017 07:19   Dg Hand Complete Right  Result Date: 07/25/2017 CLINICAL DATA:  Hand pain and weakness, no known injury, initial encounter EXAM: RIGHT HAND - COMPLETE 3+ VIEW COMPARISON:  None. FINDINGS: Examination is somewhat limited secondary to patient's inability to straighten is fingers. Mild osteopenia is seen. No definitive acute fracture is seen. Diffuse vascular calcifications are noted. IMPRESSION: No acute abnormality noted. Electronically Signed   By: Inez Catalina M.D.   On: 07/25/2017 16:56    Anti-infectives: Anti-infectives (From admission, onward)   Start     Dose/Rate Route Frequency Ordered Stop   07/25/17 2200  piperacillin-tazobactam (ZOSYN) IVPB 2.25 g     2.25 g 100 mL/hr over 30 Minutes Intravenous Every 8 hours 07/25/17 2107     07/25/17 2030  cefTRIAXone (ROCEPHIN) 1 g in sodium chloride 0.9 % 100 mL IVPB  Status:  Discontinued     1 g 200 mL/hr over 30 Minutes Intravenous Every 24 hours 07/25/17 2024 07/25/17 2045      Assessment/Plan: 52M Gallstones with signs of chronic cholecystitis without acute exacerbation Elevated liver function studies Advanced age Dementia Elevated troponin  Awaiting MRCP Once duct cleared, would likely just recommend non op  tx of gallbladder as patient with no pain currently as he is not an ideal surgical candidate.      LOS: 2 days    Rosario Jacks., [facility] 07/27/2017

## 2017-07-28 DIAGNOSIS — K838 Other specified diseases of biliary tract: Secondary | ICD-10-CM

## 2017-07-28 DIAGNOSIS — R112 Nausea with vomiting, unspecified: Secondary | ICD-10-CM

## 2017-07-28 DIAGNOSIS — K8071 Calculus of gallbladder and bile duct without cholecystitis with obstruction: Secondary | ICD-10-CM

## 2017-07-28 DIAGNOSIS — R945 Abnormal results of liver function studies: Secondary | ICD-10-CM

## 2017-07-28 DIAGNOSIS — D72829 Elevated white blood cell count, unspecified: Secondary | ICD-10-CM

## 2017-07-28 LAB — COMPREHENSIVE METABOLIC PANEL
ALT: 140 U/L — ABNORMAL HIGH (ref 17–63)
AST: 136 U/L — AB (ref 15–41)
Albumin: 2.4 g/dL — ABNORMAL LOW (ref 3.5–5.0)
Alkaline Phosphatase: 293 U/L — ABNORMAL HIGH (ref 38–126)
Anion gap: 12 (ref 5–15)
BUN: 66 mg/dL — AB (ref 6–20)
CHLORIDE: 112 mmol/L — AB (ref 101–111)
CO2: 24 mmol/L (ref 22–32)
Calcium: 9.4 mg/dL (ref 8.9–10.3)
Creatinine, Ser: 2.33 mg/dL — ABNORMAL HIGH (ref 0.61–1.24)
GFR calc Af Amer: 26 mL/min — ABNORMAL LOW (ref >60)
GFR calc non Af Amer: 22 mL/min — ABNORMAL LOW (ref >60)
Glucose, Bld: 134 mg/dL — ABNORMAL HIGH (ref 65–99)
POTASSIUM: 3.4 mmol/L — AB (ref 3.5–5.1)
SODIUM: 148 mmol/L — AB (ref 135–145)
Total Bilirubin: 3.1 mg/dL — ABNORMAL HIGH (ref 0.3–1.2)
Total Protein: 6.3 g/dL — ABNORMAL LOW (ref 6.5–8.1)

## 2017-07-28 LAB — GLUCOSE, CAPILLARY
GLUCOSE-CAPILLARY: 117 mg/dL — AB (ref 65–99)
GLUCOSE-CAPILLARY: 136 mg/dL — AB (ref 65–99)
GLUCOSE-CAPILLARY: 143 mg/dL — AB (ref 65–99)
Glucose-Capillary: 129 mg/dL — ABNORMAL HIGH (ref 65–99)
Glucose-Capillary: 138 mg/dL — ABNORMAL HIGH (ref 65–99)

## 2017-07-28 LAB — CBC
HCT: 41.5 % (ref 39.0–52.0)
HEMOGLOBIN: 13.6 g/dL (ref 13.0–17.0)
MCH: 27.8 pg (ref 26.0–34.0)
MCHC: 32.8 g/dL (ref 30.0–36.0)
MCV: 84.7 fL (ref 78.0–100.0)
Platelets: 756 10*3/uL — ABNORMAL HIGH (ref 150–400)
RBC: 4.9 MIL/uL (ref 4.22–5.81)
RDW: 13.6 % (ref 11.5–15.5)
WBC: 15.4 10*3/uL — ABNORMAL HIGH (ref 4.0–10.5)

## 2017-07-28 NOTE — Progress Notes (Signed)
[facility] Progress Note     Subjective: CC:  Pt denies abdominal pain. Son at bedside, daughter-in-law on the phone.  Objective: Vital signs in last 24 hours: Temp:  [97.6 F (36.4 C)-98.8 F (37.1 C)] 97.9 F (36.6 C) (06/04 0456) Pulse Rate:  [71-110] 71 (06/04 0456) Resp:  [17-18] 18 (06/04 0456) BP: (121-198)/(45-81) 143/81 (06/04 0456) SpO2:  [98 %-100 %] 100 % (06/04 0456)    Intake/Output from previous day: 06/03 0701 - 06/04 0700 In: 1480 [P.O.:480; I.V.:750; IV Piggyback:250] Out: 0  Intake/Output this shift: No intake/output data recorded.  PE: Gen:  Alert, pleasantly demented, NAD Card:  Regular rate and rhythm, pedal pulses 2+ BL Pulm:  Normal effort, clear to auscultation bilaterally Abd: Soft, mild subjective TTP epigastrium, overall non-tender, mild distention, no peritonitis, no HSM Skin: warm and dry, no rashes  Psych: A&Ox3   Lab Results:  Recent Labs    07/26/17 0423 07/28/17 0440  WBC 15.6* 15.4*  HGB 14.5 13.6  HCT 44.1 41.5  PLT 762* 756*   BMET Recent Labs    07/27/17 1507 07/28/17 0749  NA 150* 148*  K 4.3 3.4*  CL 116* 112*  CO2 20* 24  GLUCOSE 110* 134*  BUN 63* 66*  CREATININE 2.24* 2.33*  CALCIUM 9.5 9.4   PT/INR No results for input(s): LABPROT, INR in the last 72 hours. CMP     Component Value Date/Time   NA 148 (H) 07/28/2017 0749   NA 141 12/16/2011 1416   K 3.4 (L) 07/28/2017 0749   K 4.3 12/16/2011 1416   CL 112 (H) 07/28/2017 0749   CL 105 12/16/2011 1416   CO2 24 07/28/2017 0749   CO2 27 12/16/2011 1416   GLUCOSE 134 (H) 07/28/2017 0749   GLUCOSE 100 (H) 12/16/2011 1416   BUN 66 (H) 07/28/2017 0749   BUN 20.0 12/16/2011 1416   CREATININE 2.33 (H) 07/28/2017 0749   CREATININE 1.4 (H) 12/16/2011 1416   CALCIUM 9.4 07/28/2017 0749   CALCIUM 10.1 12/16/2011 1416   PROT 6.3 (L) 07/28/2017 0749   PROT 7.5 12/16/2011 1416   ALBUMIN 2.4 (L) 07/28/2017 0749   ALBUMIN 3.7 12/16/2011 1416   AST  136 (H) 07/28/2017 0749   AST 15 12/16/2011 1416   ALT 140 (H) 07/28/2017 0749   ALT 15 12/16/2011 1416   ALKPHOS 293 (H) 07/28/2017 0749   ALKPHOS 99 12/16/2011 1416   BILITOT 3.1 (H) 07/28/2017 0749   BILITOT 0.40 12/16/2011 1416   GFRNONAA 22 (L) 07/28/2017 0749   GFRAA 26 (L) 07/28/2017 0749   Lipase  No results found for: LIPASE     Studies/Results: No results found.  Anti-infectives: Anti-infectives (From admission, onward)   Start     Dose/Rate Route Frequency Ordered Stop   07/25/17 2200  piperacillin-tazobactam (ZOSYN) IVPB 2.25 g     2.25 g 100 mL/hr over 30 Minutes Intravenous Every 8 hours 07/25/17 2107     07/25/17 2030  cefTRIAXone (ROCEPHIN) 1 g in sodium chloride 0.9 % 100 mL IVPB  Status:  Discontinued     1 g 200 mL/hr over 30 Minutes Intravenous Every 24 hours 07/25/17 2024 07/25/17 2045     Assessment/Plan Advanced age Dementia Elevated troponin Hypokalemia   38M Gallstones with signs of chronic cholecystitis without acute exacerbation - Patient may still benefit from HIDA scan to confirm chronic cholecystitis. If cholecystitis confirmed, would recommend percutaneous drainage of the gallbaldder rather than lap chole.   Elevated liver  function studies and dilated CBD -  Persistent hyperbilirubinemia and elevation of LFTs, stable compared to yesterday. Leukocytosis 15.4 from 15.6   FEN - CLD ID - Zosyn VTE - SCD's   Plan - continue IV abx. Suspect chronic cholecystitis. Possible ERCP by GI - we will follow their recommendations and appreciate their help.     LOS: 3 days    [facility] 07/28/2017, 9:20 AM Pager: [phone] Consults: [phone] Mon-Fri 7:00 am-4:30 pm Sat-Sun 7:00 am-11:30 am

## 2017-07-28 NOTE — Consult Note (Addendum)
Consultation  Referring Provider:  Dr. Wynelle Cleveland    Primary Care Physician:  [name], MD (Inactive) Primary Gastroenterologist:  Dr. Deatra Ina       Reason for Consultation: Dilated CBD, Elevated LFT's             HPI:   Mathew Waller is a 84 y.o. male with a past medical history of hypertension and others listed below including dementia, who presented to the ER on 07/25/2017 with a complaint of nausea and vomiting over the previous 4 days.  We were consulted today in regards to elevated LFTs and a dilated common bile duct.    Today, patient's son is by his bedside and elicits all of his history.  Patient does not answer my questions just nods his head.  Apparently patient was well until the end of April/beginning of May when he started to experience a lot of nausea and vomiting per his son.  Was unable to take his pills as prescribed.  He started to become lethargic and ill-appearing.    Son tells me they have attempted MRCP twice and this was unsuccessful.  Son's wife is an Therapist, sports and called during time of my evaluation.    Denies fever, chills, chest pain or shortness of breath.  Family history is positive for 2 of his children having colon cancer.  ED Course: In the ER labs revealed increased creatinine from baseline with elevated AST ALT and bilirubin.  X-ray of the right hand did not show anything acute.  UA shows features concerning for UTI.  Sonogram of the abdomen done shows features concerning for chronic cholecystitis with CBD dilatation.  MRCP has been ordered which is pending.  Patient started on empiric antibiotics.  Patient is also found to have markedly elevated blood pressure.  Patient appears nonfocal.  Previous GI history: 01/20/2012 office visit, Dr. Deatra Ina: For weight loss; thought secondary to poor dentition 05/02/2004 colonoscopy Dr. Deatra Ina: Impression: Colon polyps, internal hemorrhoids and diverticulosis  Past Medical History:  Diagnosis Date  . Dementia   .  Dyslipidemia   . Family history of colon cancer    son deseased with colon cancer  . Gout   . Renal disease    stage 3  . 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 Unspecified essential hypertension     Past Surgical History:  Procedure Laterality Date  . CATARACT EXTRACTION      Family History  Problem Relation Age of Onset  . Colon cancer Son        died age 54  . Diabetes Unknown   . Hypertension Unknown   . Colon cancer Daughter      Social History   Tobacco Use  . Smoking status: Former Smoker    Packs/day: 0.25    Years: 38.00    Pack years: 9.50    Types: Cigarettes    Last attempt to quit: 12/15/1976    Years since quitting: 40.6  . Smokeless tobacco: Never Used  Substance Use Topics  . Alcohol use: No  . Drug use: No    Prior to Admission medications   Medication Sig Start Date End Date Taking? Authorizing Provider  amLODipine (NORVASC) 10 MG tablet Take 10 mg by mouth daily.   Yes [provider]  hydrALAZINE (APRESOLINE) 25 MG tablet Take 25 mg by mouth 2 (two) times daily. 06/22/17  Yes [provider]  metoprolol succinate (TOPROL-XL) 100 MG 24 hr tablet Take 100 mg by mouth daily. 03/24/16  Yes [provider]  trolamine salicylate (ASPERCREME) 10 % cream Apply 1 application topically as needed for muscle pain.   Yes [provider]    Current Facility-Administered Medications  Medication Dose Route Frequency Provider Last Rate Last Dose  . acetaminophen (TYLENOL) tablet 650 mg  650 mg Oral Q6H PRN Rise Patience, MD       Or  . acetaminophen (TYLENOL) suppository 650 mg  650 mg Rectal Q6H PRN Rise Patience, MD      . haloperidol lactate (HALDOL) injection 2 mg  2 mg Intravenous Once PRN Debbe Odea, MD      . hydrALAZINE (APRESOLINE) injection 10 mg  10 mg Intravenous Q4H PRN Rise Patience, MD   10 mg at 07/26/17 0727  . hydrALAZINE (APRESOLINE) injection 10 mg  10 mg Intravenous Q6H Debbe Odea, MD    10 mg at 07/28/17 0509  . LORazepam (ATIVAN) injection 0.5 mg  0.5 mg Intravenous Once Rizwan, Eunice Blase, MD      . metoprolol tartrate (LOPRESSOR) injection 5 mg  5 mg Intravenous Q6H Debbe Odea, MD   5 mg at 07/28/17 0509  . ondansetron (ZOFRAN) tablet 4 mg  4 mg Oral Q6H PRN Rise Patience, MD       Or  . ondansetron [facility]) injection 4 mg  4 mg Intravenous Q6H PRN Rise Patience, MD   4 mg at 07/26/17 2300  . piperacillin-tazobactam (ZOSYN) IVPB 2.25 g  2.25 g Intravenous Q8H Rise Patience, MD   Stopped at 07/28/17 [phone]  . sodium bicarbonate 100 mEq in dextrose 5 % 1,000 mL infusion   Intravenous Continuous Debbe Odea, MD 100 mL/hr at 07/28/17 0849      Allergies as of 07/25/2017  . (No Known Allergies)     Review of Systems:    Limited due to mental status-see HPI   Physical Exam:  Vital signs in last 24 hours: Temp:  [97.6 F (36.4 C)-98.8 F (37.1 C)] 97.9 F (36.6 C) (06/04 0456) Pulse Rate:  [71-110] 71 (06/04 0456) Resp:  [17-18] 18 (06/04 0456) BP: (121-198)/(45-81) 143/81 (06/04 0456) SpO2:  [98 %-100 %] 100 % (06/04 0456)   General:   Pleasant Elderly AA male, ill-appearing, in NAD, Well developed, alert and cooperative Head:  Normocephalic and atraumatic. Eyes:   PEERL, EOMI. No icterus. Conjunctiva pink. Ears:  Normal auditory acuity. Neck:  Supple Throat: Oral cavity and pharynx without inflammation, swelling or lesion.  Lungs: Respirations even and unlabored. Lungs clear to auscultation bilaterally.   No wheezes, crackles, or rhonchi.  Heart: Normal S1, S2. No MRG. Regular rate and rhythm. No peripheral edema, cyanosis or pallor.  Abdomen:  Soft, nondistended, Mild ttp over RUQ, No rebound or guarding. Normal bowel sounds. No appreciable masses or hepatomegaly. Rectal:  Not performed.  Msk:  Symmetrical without gross deformities. Peripheral pulses intact.  Extremities:  Without edema, no deformity or joint abnormality. Neurologic:  Alert  and  oriented;  grossly normal neurologically.  Skin:   Dry and intact without significant lesions or rashes. Psychiatric: Dementia, unable to answer my questions   LAB RESULTS: Recent Labs    07/25/17 1204 07/26/17 0423 07/28/17 0440  WBC 13.0* 15.6* 15.4*  HGB 14.2 14.5 13.6  HCT 43.0 44.1 41.5  PLT 854* 762* 756*   BMET Recent Labs    07/27/17 0818 07/27/17 1507 07/28/17 0749  NA 148* 150* 148*  K 5.1 4.3 3.4*  CL 114* 116* 112*  CO2  20* 20* 24  GLUCOSE 110* 110* 134*  BUN 60* 63* 66*  CREATININE 2.12* 2.24* 2.33*  CALCIUM 9.6 9.5 9.4   LFT Recent Labs    07/26/17 0423  07/28/17 0749  PROT 7.0   < > 6.3*  ALBUMIN 2.7*   < > 2.4*  AST 129*   < > 136*  ALT 139*   < > 140*  ALKPHOS 283*   < > 293*  BILITOT 3.1*   < > 3.1*  BILIDIR 1.9*  --   --   IBILI 1.2*  --   --    < > = values in this interval not displayed.   EXAM: ABDOMEN ULTRASOUND COMPLETE 07/25/17  COMPARISON:  None.  FINDINGS: Gallbladder: There is a 1.7 cm calcified shadowing gallstone in the contracted gallbladder. Apparent mild diffuse gallbladder wall thickening. No pericholecystic fluid. No sonographic Murphy's sign.  Common bile duct: Diameter: 8 mm, mildly dilated. No choledocholithiasis demonstrated, noting nonvisualization of the distal common bile duct due to overlying bowel gas.  Liver: Liver parenchyma is mildly heterogeneous, with normal echogenicity. No liver mass. Portal vein is patent on color Doppler imaging with normal direction of blood flow towards the liver.  IVC: No abnormality visualized.  Pancreas: Visualized portion unremarkable, with top-normal main pancreatic duct.  Spleen: Size and appearance within normal limits.  Right Kidney: Length: 9.7 cm. Echogenic right renal parenchyma. No right hydronephrosis. No right renal mass.  Left Kidney: Length: 10.1 cm. Echogenic left renal parenchyma. No left hydronephrosis. No left renal mass.  Abdominal  aorta: Atherosclerotic abdominal aorta with ectatic 2.7 cm infrarenal abdominal aorta.  Other findings: Small volume upper abdominal ascites.  IMPRESSION: 1. Cholelithiasis. Contracted gallbladder. Apparent mild diffuse gallbladder wall thickening. No pericholecystic fluid. No sonographic Murphy's sign. Sonographic findings are most compatible with chronic calculous cholecystitis. 2. Mildly dilated common bile duct (8 mm diameter). No choledocholithiasis demonstrated, noting nonvisualization of the distal CBD due to overlying bowel gas. Recommend correlation with serum bilirubin levels. Consider MRI abdomen with MRCP without and with IV contrast for further evaluation, as clinically warranted. 3. Nonspecific mild liver parenchymal heterogeneity, cannot exclude hepatocellular disease. No liver mass. 4. Echogenic kidneys, indicative of nonspecific renal parenchymal disease of uncertain chronicity. No hydronephrosis. 5. Ectatic 2.7 cm infrarenal abdominal aorta, at risk for aneurysm development. Recommend follow-up aortic ultrasound in 5 years. This recommendation follows ACR consensus guidelines: White Paper of the ACR Incidental Findings Committee II on Vascular Findings. J Am Coll Radiol 2013; 10:789-794.   Electronically Signed   By: Ilona Sorrel M.D.   On: 07/25/2017 19:45    Impression / Plan:   Impression: 1.  Elevated LFTs with common bile duct dilation: AST 129--> 136, ALT 139--> 140, alk phos 283--> 293, bili 3.1--> 3.1, ultrasound 07/25/2017 with cholelithiasis, contracted gallbladder and apparent mild diffuse gallbladder wall thickening as well as mildly dilated common bile duct (8 mm diameter), no choledocholithiasis demonstrated, noting nonvisualization of the distal common bile duct due to overlying bowel gas, MRCP attempted 07/27/2017 and unsuccessful due to patient's dementia 2.  Nausea and vomiting: Likely with above 3.  Possible UTI: Empiric antibiotics 4.  Right  hand pain and swelling: Thought related to gout 5.  Acute renal failure on CKD stage III 6. Leukocytosis:WBC 13.0-->15.4, ? Relation to choledocholithiasis/chronic cystitis  Plan: 1.  Appreciate surgical recommendations 2.  Continue antibiotics 3.  Will discuss with Dr. Loletha Carrow.  Likely patient would benefit from ERCP for further evaluation.  Did discuss risk,  benefits, limitations and alternatives with the patient, his son and his daughter-in-law today.  They agreed to proceed if necessary. 4.  Continue current diet, if procedure is planned for tomorrow, patient will be n.p.o. after midnight. 5.  Please await final recommendations from Dr. Loletha Carrow later today.  Thank you for your kind consultation, we will continue to follow.  Lavone Nian Lemmon  07/28/2017, 8:51 AM Pager #: [phone]   I have reviewed the entire case in detail with the above APP and discussed the plan in detail.  Therefore, I agree with the diagnoses recorded above. In addition,  I have personally interviewed and examined the patient and have personally reviewed any abdominal/pelvic CT scan images.  My additional thoughts are as follows:  Elderly man with recent UGI symptoms difficult to characterize due to dementia. Now with gallstone and LFTs elevated and CBD 60m on ultrasound.  Taken together, there is a high clinical suspicion for choledocholithiasis.  He does not clearly have cholangitis, but leukocytosis worrisome, especially at this age.  Agree with empiric Abx.  ERCP with sphincterotomy and stone extraction tomorrow.  Discussed at length with his son RDelfino Lovettand Richard's wife.  They agree to the procedure and all questions were answered.   The benefits and risks of the planned procedure were described in detail with the patient or (when appropriate) their health care proxy.  Risks were outlined as including, but not limited to, bleeding, infection, perforation, adverse medication reaction leading to cardiac or  pulmonary decompensation, or pancreatitis.  The limitation of incomplete mucosal visualization was also discussed.  No guarantees or warranties were given. Patient at increased risk for cardiopulmonary complications of procedure due to advanced age and medical comorbidities.   Surgical consultant has recommended no cholecystectomy at this time, which seems prudent.  HNelida MeuseIII Office:[phone]

## 2017-07-28 NOTE — Progress Notes (Signed)
PROGRESS NOTE    Mathew Waller   ZOX:[id number]  DOB: [birth year]  DOA: 07/25/2017 PCP: [name], MD (Inactive)   Brief Narrative:  Mathew Waller  a 84 y.o. male with history of dementia and hypertension presents to the ER with complaints of persistent nausea vomiting over the last 4 days. History obtained from son in ED.   He was uable to take his medications due to the vomiting and also had increased pain in the right hand with difficult to make fist  LFTS noted to be elevated, Ultrasound revealed gallstones and CBD dilated to 8 mm. BP was as high as 205/86   Subjective: Non verbal. Son at bedside states that being minimally verbal is his baseline. No issues overnight.    Assessment & Plan:   Principal Problem:   Nausea & vomiting with elevated LFTs and CBD dilatation and  Cholelithiasis Leukocytosis - awaiting MRCP still- will need a sedative - he does not seem to have pain, vomiting or fevers in hospital-  - on Zosyn - started clear liquids but not eating or drinking  - unable to tolerate MRCP due to agitation- have consulted GI today- gen surgery following  Active Problems:   ARF (acute renal failure) vs CKD  - last Cr in 2013 was 1.4- has been hydrated - Cr 2.52>> 2.04> 2.12- cont IVF- change to D5 with 2 amp Bicarb due to acidosis -6/4-  Cr sill 2.33 today- likely chronic kidney disease - follow  - acidosis improved on Bicarb- cont to follow  ? Swallowing issues - tried to drink yesterday but could not swallow and had to spit it out  Dementia - mostly non-verbal - follows some commands  Acute bronchitis? - 6/3- coughed up yellow mucous today- CXR on admission negative - on Zosyn    Hypertensive urgency - due to inability to take Norvasc, Hydralazine and Toprol at home (vomiting) - currently on Hydralazine 10 mg QID, Lopressor 5mg  Q6 hrs - BP improved-- check vitals Q4.   Elevated troponin - due to HTN-   Right hand pain? - 6/2- no pain & no  swelling in hand - Uric acid is 11.4 - xray done in ED unrevealing   DVT prophylaxis: SCDs Code Status: Full code Family Communication:  Disposition Plan: follow LFTs and symptoms Consultants:   gen surgery Procedures:    Antimicrobials:  Anti-infectives (From admission, onward)   Start     Dose/Rate Route Frequency Ordered Stop   07/25/17 2200  piperacillin-tazobactam (ZOSYN) IVPB 2.25 g     2.25 g 100 mL/hr over 30 Minutes Intravenous Every 8 hours 07/25/17 2107     07/25/17 2030  cefTRIAXone (ROCEPHIN) 1 g in sodium chloride 0.9 % 100 mL IVPB  Status:  Discontinued     1 g 200 mL/hr over 30 Minutes Intravenous Every 24 hours 07/25/17 2024 07/25/17 2045       Objective: Vitals:   07/27/17 2058 07/28/17 0026 07/28/17 0212 07/28/17 0456  BP: (!) 130/45 (!) 198/74 (!) 121/55 (!) 143/81  Pulse: 100 (!) 110 93 71  Resp: 18 18  18   Temp: 98 F (36.7 C) 98.8 F (37.1 C)  97.9 F (36.6 C)  TempSrc: Oral Oral  Oral  SpO2: 100% 98%  100%  Weight:      Height:        Intake/Output Summary (Last 24 hours) at 07/28/2017 1255 Last data filed at 07/28/2017 1004 Gross per 24 hour  Intake 1190 ml  Output 0 ml  Net 1190 ml   Filed Weights   07/25/17 1140 07/25/17 2300  Weight: 67.1 kg (148 lb) 55.4 kg (122 lb 2.2 oz)    Examination: General exam: Appears comfortable  HEENT: PERRLA, oral mucosa moist, no sclera icterus or thrush Respiratory system: Clear to auscultation. Respiratory effort normal. Cardiovascular system: S1 & S2 heard, RRR.   Gastrointestinal system: Abdomen soft, non-tender, nondistended. Normal bowel sound. No organomegaly Central nervous system: Alert - nonverbal today- No focal neurological deficits. Extremities: No cyanosis, clubbing or edema Skin: No rashes or ulcers    Data Reviewed: I have personally reviewed following labs and imaging studies  CBC: Recent Labs  Lab 07/25/17 1204 07/26/17 0423 07/28/17 0440  WBC 13.0* 15.6* 15.4*    NEUTROABS 10.7* 12.9*  --   HGB 14.2 14.5 13.6  HCT 43.0 44.1 41.5  MCV 85.3 83.4 84.7  PLT 854* 762* 376*   Basic Metabolic Panel: Recent Labs  Lab 07/25/17 1204 07/26/17 0423 07/27/17 0818 07/27/17 1507 07/28/17 0749  NA 146* 145 148* 150* 148*  K 4.8 4.2 5.1 4.3 3.4*  CL 107 111 114* 116* 112*  CO2 26 20* 20* 20* 24  GLUCOSE 126* 109* 110* 110* 134*  BUN 56* 55* 60* 63* 66*  CREATININE 2.52* 2.04* 2.12* 2.24* 2.33*  CALCIUM 10.1 9.5 9.6 9.5 9.4   GFR: Estimated Creatinine Clearance: 15.2 mL/min (A) (by C-G formula based on SCr of 2.33 mg/dL (H)). Liver Function Tests: Recent Labs  Lab 07/25/17 1204 07/26/17 0423 07/27/17 0818 07/27/17 1507 07/28/17 0749  AST 142* 129* 132* 121* 136*  ALT 152* 139* 143* 139* 140*  ALKPHOS 288* 283* 314* 292* 293*  BILITOT 3.2* 3.1* 3.9* 3.3* 3.1*  PROT 7.8 7.0 6.6 6.5 6.3*  ALBUMIN 2.9* 2.7* 2.7* 2.5* 2.4*   No results for input(s): LIPASE, AMYLASE in the last 168 hours. No results for input(s): AMMONIA in the last 168 hours. Coagulation Profile: No results for input(s): INR, PROTIME in the last 168 hours. Cardiac Enzymes: Recent Labs  Lab 07/25/17 2342 07/26/17 0630  TROPONINI 0.06* 0.09*   BNP (last 3 results) No results for input(s): PROBNP in the last 8760 hours. HbA1C: No results for input(s): HGBA1C in the last 72 hours. CBG: Recent Labs  Lab 07/27/17 1132 07/27/17 1714 07/28/17 0023 07/28/17 0518 07/28/17 1151  GLUCAP 102* 95 117* 129* 136*   Lipid Profile: No results for input(s): CHOL, HDL, LDLCALC, TRIG, CHOLHDL, LDLDIRECT in the last 72 hours. Thyroid Function Tests: No results for input(s): TSH, T4TOTAL, FREET4, T3FREE, THYROIDAB in the last 72 hours. Anemia Panel: No results for input(s): VITAMINB12, FOLATE, FERRITIN, TIBC, IRON, RETICCTPCT in the last 72 hours. Urine analysis:    Component Value Date/Time   COLORURINE AMBER (A) 07/25/2017 1540   APPEARANCEUR CLOUDY (A) 07/25/2017 1540    LABSPEC 1.021 07/25/2017 1540   PHURINE 5.0 07/25/2017 1540   GLUCOSEU NEGATIVE 07/25/2017 1540   HGBUR MODERATE (A) 07/25/2017 1540   BILIRUBINUR MODERATE (A) 07/25/2017 1540   KETONESUR 5 (A) 07/25/2017 1540   PROTEINUR 100 (A) 07/25/2017 1540   NITRITE POSITIVE (A) 07/25/2017 1540   LEUKOCYTESUR NEGATIVE 07/25/2017 1540   Sepsis Labs: @LABRCNTIP (procalcitonin:4,lacticidven:4) )No results found for this or any previous visit (from the past 240 hour(s)).       Radiology Studies: No results found.    Scheduled Meds: . hydrALAZINE  10 mg Intravenous Q6H  . LORazepam  0.5 mg Intravenous Once  . metoprolol tartrate  5 mg Intravenous Q6H  Continuous Infusions: . piperacillin-tazobactam (ZOSYN)  IV Stopped (07/28/17 0539)  .  sodium bicarbonate  infusion 1000 mL 100 mL/hr at 07/28/17 0849     LOS: 3 days    Time spent in minutes: 35    Debbe Odea, MD [facility] Pager: [email or web address] Password [email or web address] 07/28/2017, 12:55 PM

## 2017-07-28 NOTE — Progress Notes (Signed)
Pharmacy Antibiotic Note  Mathew Waller is a 84 y.o. male admitted on 07/25/2017 with intra-abdominal infection.  Ultrasound shows gallstones.  Pharmacy has been consulted for Zosyn dosing. 07/28/2017 Pt unable to complete MRCP 6/3 - pt did not tolerate.  WBC 15.4, SCr 2.24. AF.    Plan: Zosyn 2.25gm IV q8h Follow renal function  Height: 5\' 6"  (167.6 cm) Weight: 122 lb 2.2 oz (55.4 kg) IBW/kg (Calculated) : 63.8  Temp (24hrs), Avg:98 F (36.7 C), Min:97.6 F (36.4 C), Max:98.8 F (37.1 C)  Recent Labs  Lab 07/25/17 1204 07/26/17 0423 07/27/17 0818 07/27/17 1507 07/28/17 0440  WBC 13.0* 15.6*  --   --  15.4*  CREATININE 2.52* 2.04* 2.12* 2.24*  --     Estimated Creatinine Clearance: 15.8 mL/min (A) (by C-G formula based on SCr of 2.24 mg/dL (H)).    No Known Allergies  Antimicrobials this admission: 6/1 Zosyn >>   Dose adjustments this admission: none Microbiology results: none Thank you for allowing pharmacy to be a part of this patient's care.  Eudelia Bunch, Pharm.D. [phone] 07/28/2017 7:24 AM

## 2017-07-28 NOTE — H&P (View-Only) (Signed)
Consultation  Referring Provider:  Dr. Wynelle Waller    Primary Care Physician:  Mathew Palau, MD (Inactive) Primary Gastroenterologist:  Dr. Deatra Waller       Reason for Consultation: Dilated CBD, Elevated LFT's             HPI:   Mathew Waller is a 84 y.o. male with a past medical history of hypertension and others listed below including dementia, who presented to the ER on 07/25/2017 with a complaint of nausea and vomiting over the previous 4 days.  We were consulted today in regards to elevated LFTs and a dilated common bile duct.    Today, patient's son is by his bedside and elicits all of his history.  Patient does not answer my questions just nods his head.  Apparently patient was well until the end of April/beginning of May when he started to experience a lot of nausea and vomiting per his son.  Was unable to take his pills as prescribed.  He started to become lethargic and ill-appearing.    Son tells me they have attempted MRCP twice and this was unsuccessful.  Son's wife is an Therapist, sports and called during time of my evaluation.    Denies fever, chills, chest pain or shortness of breath.  Family history is positive for 2 of his children having colon cancer.  ED Course: In the ER labs revealed increased creatinine from baseline with elevated AST ALT and bilirubin.  X-ray of the right hand did not show anything acute.  UA shows features concerning for UTI.  Sonogram of the abdomen done shows features concerning for chronic cholecystitis with CBD dilatation.  MRCP has been ordered which is pending.  Patient started on empiric antibiotics.  Patient is also found to have markedly elevated blood pressure.  Patient appears nonfocal.  Previous GI history: 01/20/2012 office visit, Dr. Deatra Waller: For weight loss; thought secondary to poor dentition 05/02/2004 colonoscopy Dr. Deatra Waller: Impression: Colon polyps, internal hemorrhoids and diverticulosis  Past Medical History:  Diagnosis Date  . Dementia   .  Dyslipidemia   . Family history of colon cancer    son deseased with colon cancer  . Gout   . Renal disease    stage 3  . 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 Unspecified essential hypertension     Past Surgical History:  Procedure Laterality Date  . CATARACT EXTRACTION      Family History  Problem Relation Age of Onset  . Colon cancer Son        died age 50  . Diabetes Unknown   . Hypertension Unknown   . Colon cancer Daughter      Social History   Tobacco Use  . Smoking status: Former Smoker    Packs/day: 0.25    Years: 38.00    Pack years: 9.50    Types: Cigarettes    Last attempt to quit: 12/15/1976    Years since quitting: 40.6  . Smokeless tobacco: Never Used  Substance Use Topics  . Alcohol use: No  . Drug use: No    Prior to Admission medications   Medication Sig Start Date End Date Taking? Authorizing Provider  amLODipine (NORVASC) 10 MG tablet Take 10 mg by mouth daily.   Yes [provider]  hydrALAZINE (APRESOLINE) 25 MG tablet Take 25 mg by mouth 2 (two) times daily. 06/22/17  Yes [provider]  metoprolol succinate (TOPROL-XL) 100 MG 24 hr tablet Take 100 mg by mouth daily. 03/24/16  Yes [provider]  trolamine salicylate (ASPERCREME) 10 % cream Apply 1 application topically as needed for muscle pain.   Yes [provider]    Current Facility-Administered Medications  Medication Dose Route Frequency Provider Last Rate Last Dose  . acetaminophen (TYLENOL) tablet 650 mg  650 mg Oral Q6H PRN Rise Patience, MD       Or  . acetaminophen (TYLENOL) suppository 650 mg  650 mg Rectal Q6H PRN Rise Patience, MD      . haloperidol lactate (HALDOL) injection 2 mg  2 mg Intravenous Once PRN Debbe Odea, MD      . hydrALAZINE (APRESOLINE) injection 10 mg  10 mg Intravenous Q4H PRN Rise Patience, MD   10 mg at 07/26/17 0727  . hydrALAZINE (APRESOLINE) injection 10 mg  10 mg Intravenous Q6H Debbe Odea, MD    10 mg at 07/28/17 0509  . LORazepam (ATIVAN) injection 0.5 mg  0.5 mg Intravenous Once Rizwan, Eunice Blase, MD      . metoprolol tartrate (LOPRESSOR) injection 5 mg  5 mg Intravenous Q6H Debbe Odea, MD   5 mg at 07/28/17 0509  . ondansetron (ZOFRAN) tablet 4 mg  4 mg Oral Q6H PRN Rise Patience, MD       Or  . ondansetron [facility]) injection 4 mg  4 mg Intravenous Q6H PRN Rise Patience, MD   4 mg at 07/26/17 2300  . piperacillin-tazobactam (ZOSYN) IVPB 2.25 g  2.25 g Intravenous Q8H Rise Patience, MD   Stopped at 07/28/17 [id number]  . sodium bicarbonate 100 mEq in dextrose 5 % 1,000 mL infusion   Intravenous Continuous Debbe Odea, MD 100 mL/hr at 07/28/17 0849      Allergies as of 07/25/2017  . (No Known Allergies)     Review of Systems:    Limited due to mental status-see HPI   Physical Exam:  Vital signs in last 24 hours: Temp:  [97.6 F (36.4 C)-98.8 F (37.1 C)] 97.9 F (36.6 C) (06/04 0456) Pulse Rate:  [71-110] 71 (06/04 0456) Resp:  [17-18] 18 (06/04 0456) BP: (121-198)/(45-81) 143/81 (06/04 0456) SpO2:  [98 %-100 %] 100 % (06/04 0456)   General:   Pleasant Elderly AA male, ill-appearing, in NAD, Well developed, alert and cooperative Head:  Normocephalic and atraumatic. Eyes:   PEERL, EOMI. No icterus. Conjunctiva pink. Ears:  Normal auditory acuity. Neck:  Supple Throat: Oral cavity and pharynx without inflammation, swelling or lesion.  Lungs: Respirations even and unlabored. Lungs clear to auscultation bilaterally.   No wheezes, crackles, or rhonchi.  Heart: Normal S1, S2. No MRG. Regular rate and rhythm. No peripheral edema, cyanosis or pallor.  Abdomen:  Soft, nondistended, Mild ttp over RUQ, No rebound or guarding. Normal bowel sounds. No appreciable masses or hepatomegaly. Rectal:  Not performed.  Msk:  Symmetrical without gross deformities. Peripheral pulses intact.  Extremities:  Without edema, no deformity or joint abnormality. Neurologic:  Alert  and  oriented;  grossly normal neurologically.  Skin:   Dry and intact without significant lesions or rashes. Psychiatric: Dementia, unable to answer my questions   LAB RESULTS: Recent Labs    07/25/17 1204 07/26/17 0423 07/28/17 0440  WBC 13.0* 15.6* 15.4*  HGB 14.2 14.5 13.6  HCT 43.0 44.1 41.5  PLT 854* 762* 756*   BMET Recent Labs    07/27/17 0818 07/27/17 1507 07/28/17 0749  NA 148* 150* 148*  K 5.1 4.3 3.4*  CL 114* 116* 112*  CO2  20* 20* 24  GLUCOSE 110* 110* 134*  BUN 60* 63* 66*  CREATININE 2.12* 2.24* 2.33*  CALCIUM 9.6 9.5 9.4   LFT Recent Labs    07/26/17 0423  07/28/17 0749  PROT 7.0   < > 6.3*  ALBUMIN 2.7*   < > 2.4*  AST 129*   < > 136*  ALT 139*   < > 140*  ALKPHOS 283*   < > 293*  BILITOT 3.1*   < > 3.1*  BILIDIR 1.9*  --   --   IBILI 1.2*  --   --    < > = values in this interval not displayed.   EXAM: ABDOMEN ULTRASOUND COMPLETE 07/25/17  COMPARISON:  None.  FINDINGS: Gallbladder: There is a 1.7 cm calcified shadowing gallstone in the contracted gallbladder. Apparent mild diffuse gallbladder wall thickening. No pericholecystic fluid. No sonographic Murphy's sign.  Common bile duct: Diameter: 8 mm, mildly dilated. No choledocholithiasis demonstrated, noting nonvisualization of the distal common bile duct due to overlying bowel gas.  Liver: Liver parenchyma is mildly heterogeneous, with normal echogenicity. No liver mass. Portal vein is patent on color Doppler imaging with normal direction of blood flow towards the liver.  IVC: No abnormality visualized.  Pancreas: Visualized portion unremarkable, with top-normal main pancreatic duct.  Spleen: Size and appearance within normal limits.  Right Kidney: Length: 9.7 cm. Echogenic right renal parenchyma. No right hydronephrosis. No right renal mass.  Left Kidney: Length: 10.1 cm. Echogenic left renal parenchyma. No left hydronephrosis. No left renal mass.  Abdominal  aorta: Atherosclerotic abdominal aorta with ectatic 2.7 cm infrarenal abdominal aorta.  Other findings: Small volume upper abdominal ascites.  IMPRESSION: 1. Cholelithiasis. Contracted gallbladder. Apparent mild diffuse gallbladder wall thickening. No pericholecystic fluid. No sonographic Murphy's sign. Sonographic findings are most compatible with chronic calculous cholecystitis. 2. Mildly dilated common bile duct (8 mm diameter). No choledocholithiasis demonstrated, noting nonvisualization of the distal CBD due to overlying bowel gas. Recommend correlation with serum bilirubin levels. Consider MRI abdomen with MRCP without and with IV contrast for further evaluation, as clinically warranted. 3. Nonspecific mild liver parenchymal heterogeneity, cannot exclude hepatocellular disease. No liver mass. 4. Echogenic kidneys, indicative of nonspecific renal parenchymal disease of uncertain chronicity. No hydronephrosis. 5. Ectatic 2.7 cm infrarenal abdominal aorta, at risk for aneurysm development. Recommend follow-up aortic ultrasound in 5 years. This recommendation follows ACR consensus guidelines: White Paper of the ACR Incidental Findings Committee II on Vascular Findings. J Am Coll Radiol 2013; 10:789-794.   Electronically Signed   By: Ilona Sorrel M.D.   On: 07/25/2017 19:45    Impression / Plan:   Impression: 1.  Elevated LFTs with common bile duct dilation: AST 129--> 136, ALT 139--> 140, alk phos 283--> 293, bili 3.1--> 3.1, ultrasound 07/25/2017 with cholelithiasis, contracted gallbladder and apparent mild diffuse gallbladder wall thickening as well as mildly dilated common bile duct (8 mm diameter), no choledocholithiasis demonstrated, noting nonvisualization of the distal common bile duct due to overlying bowel gas, MRCP attempted 07/27/2017 and unsuccessful due to patient's dementia 2.  Nausea and vomiting: Likely with above 3.  Possible UTI: Empiric antibiotics 4.  Right  hand pain and swelling: Thought related to gout 5.  Acute renal failure on CKD stage III 6. Leukocytosis:WBC 13.0-->15.4, ? Relation to choledocholithiasis/chronic cystitis  Plan: 1.  Appreciate surgical recommendations 2.  Continue antibiotics 3.  Will discuss with Dr. Loletha Carrow.  Likely patient would benefit from ERCP for further evaluation.  Did discuss risk,  benefits, limitations and alternatives with the patient, his son and his daughter-in-law today.  They agreed to proceed if necessary. 4.  Continue current diet, if procedure is planned for tomorrow, patient will be n.p.o. after midnight. 5.  Please await final recommendations from Dr. Loletha Carrow later today.  Thank you for your kind consultation, we will continue to follow.  Lavone Nian Lemmon  07/28/2017, 8:51 AM Pager #: [phone]   I have reviewed the entire case in detail with the above APP and discussed the plan in detail.  Therefore, I agree with the diagnoses recorded above. In addition,  I have personally interviewed and examined the patient and have personally reviewed any abdominal/pelvic CT scan images.  My additional thoughts are as follows:  Elderly man with recent UGI symptoms difficult to characterize due to dementia. Now with gallstone and LFTs elevated and CBD 9m on ultrasound.  Taken together, there is a high clinical suspicion for choledocholithiasis.  He does not clearly have cholangitis, but leukocytosis worrisome, especially at this age.  Agree with empiric Abx.  ERCP with sphincterotomy and stone extraction tomorrow.  Discussed at length with his son RDelfino Lovettand Richard's wife.  They agree to the procedure and all questions were answered.   The benefits and risks of the planned procedure were described in detail with the patient or (when appropriate) their health care proxy.  Risks were outlined as including, but not limited to, bleeding, infection, perforation, adverse medication reaction leading to cardiac or  pulmonary decompensation, or pancreatitis.  The limitation of incomplete mucosal visualization was also discussed.  No guarantees or warranties were given. Patient at increased risk for cardiopulmonary complications of procedure due to advanced age and medical comorbidities.   Surgical consultant has recommended no cholecystectomy at this time, which seems prudent.  HNelida MeuseIII Office:[phone]

## 2017-07-29 ENCOUNTER — Inpatient Hospital Stay (HOSPITAL_COMMUNITY): Payer: Medicare HMO | Admitting: Certified Registered Nurse Anesthetist

## 2017-07-29 ENCOUNTER — Encounter (HOSPITAL_COMMUNITY): Payer: Self-pay | Admitting: Anesthesiology

## 2017-07-29 ENCOUNTER — Encounter (HOSPITAL_COMMUNITY)
Admission: EM | Disposition: A | Discharge status: Other Acute Inpatient Hospital | Payer: Self-pay | Source: Home / Self Care | Attending: Internal Medicine

## 2017-07-29 ENCOUNTER — Inpatient Hospital Stay (HOSPITAL_COMMUNITY): Payer: Medicare HMO

## 2017-07-29 DIAGNOSIS — I160 Hypertensive urgency: Secondary | ICD-10-CM

## 2017-07-29 DIAGNOSIS — K210 Gastro-esophageal reflux disease with esophagitis: Secondary | ICD-10-CM

## 2017-07-29 DIAGNOSIS — E43 Unspecified severe protein-calorie malnutrition: Secondary | ICD-10-CM

## 2017-07-29 DIAGNOSIS — N179 Acute kidney failure, unspecified: Secondary | ICD-10-CM

## 2017-07-29 DIAGNOSIS — K8020 Calculus of gallbladder without cholecystitis without obstruction: Secondary | ICD-10-CM

## 2017-07-29 DIAGNOSIS — K315 Obstruction of duodenum: Secondary | ICD-10-CM

## 2017-07-29 HISTORY — PX: PANCREATIC STENT PLACEMENT: SHX5539

## 2017-07-29 HISTORY — PX: ERCP: SHX5425

## 2017-07-29 HISTORY — PX: BALLOON DILATION: SHX5330

## 2017-07-29 LAB — COMPREHENSIVE METABOLIC PANEL
ALBUMIN: 2.2 g/dL — AB (ref 3.5–5.0)
ALK PHOS: 282 U/L — AB (ref 38–126)
ALT: 126 U/L — ABNORMAL HIGH (ref 17–63)
ALT: 130 U/L — ABNORMAL HIGH (ref 17–63)
AST: 120 U/L — AB (ref 15–41)
AST: 125 U/L — AB (ref 15–41)
Albumin: 2.1 g/dL — ABNORMAL LOW (ref 3.5–5.0)
Alkaline Phosphatase: 282 U/L — ABNORMAL HIGH (ref 38–126)
Anion gap: 13 (ref 5–15)
Anion gap: 12 (ref 5–15)
BILIRUBIN TOTAL: 3.5 mg/dL — AB (ref 0.3–1.2)
BUN: 66 mg/dL — ABNORMAL HIGH (ref 6–20)
BUN: 66 mg/dL — AB (ref 6–20)
CALCIUM: 8.9 mg/dL (ref 8.9–10.3)
CHLORIDE: 103 mmol/L (ref 101–111)
CO2: 32 mmol/L (ref 22–32)
CO2: 32 mmol/L (ref 22–32)
CREATININE: 2.37 mg/dL — AB (ref 0.61–1.24)
Calcium: 8.9 mg/dL (ref 8.9–10.3)
Chloride: 103 mmol/L (ref 101–111)
Creatinine, Ser: 2.44 mg/dL — ABNORMAL HIGH (ref 0.61–1.24)
GFR calc Af Amer: 25 mL/min — ABNORMAL LOW (ref >60)
GFR calc Af Amer: 25 mL/min — ABNORMAL LOW (ref >60)
GFR calc non Af Amer: 22 mL/min — ABNORMAL LOW (ref >60)
GFR, EST NON AFRICAN AMERICAN: 21 mL/min — AB (ref >60)
GLUCOSE: 140 mg/dL — AB (ref 65–99)
GLUCOSE: 140 mg/dL — AB (ref 65–99)
POTASSIUM: 3 mmol/L — AB (ref 3.5–5.1)
POTASSIUM: 3.1 mmol/L — AB (ref 3.5–5.1)
SODIUM: 147 mmol/L — AB (ref 135–145)
Sodium: 148 mmol/L — ABNORMAL HIGH (ref 135–145)
TOTAL PROTEIN: 5.9 g/dL — AB (ref 6.5–8.1)
Total Bilirubin: 3.4 mg/dL — ABNORMAL HIGH (ref 0.3–1.2)
Total Protein: 6 g/dL — ABNORMAL LOW (ref 6.5–8.1)

## 2017-07-29 LAB — CBC WITH DIFFERENTIAL/PLATELET
BASOS ABS: 0 10*3/uL (ref 0.0–0.1)
BASOS PCT: 0 %
EOS ABS: 0 10*3/uL (ref 0.0–0.7)
EOS PCT: 0 %
HCT: 42.3 % (ref 39.0–52.0)
Hemoglobin: 14.2 g/dL (ref 13.0–17.0)
LYMPHS PCT: 8 %
Lymphs Abs: 1.3 10*3/uL (ref 0.7–4.0)
MCH: 28.2 pg (ref 26.0–34.0)
MCHC: 33.6 g/dL (ref 30.0–36.0)
MCV: 83.9 fL (ref 78.0–100.0)
Monocytes Absolute: 2.4 10*3/uL — ABNORMAL HIGH (ref 0.1–1.0)
Monocytes Relative: 14 %
Neutro Abs: 13.1 10*3/uL — ABNORMAL HIGH (ref 1.7–7.7)
Neutrophils Relative %: 78 %
PLATELETS: 671 10*3/uL — AB (ref 150–400)
RBC: 5.04 MIL/uL (ref 4.22–5.81)
RDW: 13.6 % (ref 11.5–15.5)
WBC: 16.8 10*3/uL — AB (ref 4.0–10.5)

## 2017-07-29 LAB — GLUCOSE, CAPILLARY
GLUCOSE-CAPILLARY: 141 mg/dL — AB (ref 65–99)
Glucose-Capillary: 137 mg/dL — ABNORMAL HIGH (ref 65–99)

## 2017-07-29 LAB — PHOSPHORUS: Phosphorus: 2.5 mg/dL (ref 2.5–4.6)

## 2017-07-29 LAB — PROTIME-INR
INR: 1.27
Prothrombin Time: 15.8 seconds — ABNORMAL HIGH (ref 11.4–15.2)

## 2017-07-29 LAB — MAGNESIUM: MAGNESIUM: 2.1 mg/dL (ref 1.7–2.4)

## 2017-07-29 SURGERY — ERCP, WITH INTERVENTION IF INDICATED
Anesthesia: General

## 2017-07-29 MED ORDER — FAMOTIDINE IN NACL 20-0.9 MG/50ML-% IV SOLN
20.0000 mg | INTRAVENOUS | Status: DC
  Administered 2017-07-29: 20 mg via INTRAVENOUS
  Filled 2017-07-29: qty 50

## 2017-07-29 MED ORDER — INDOMETHACIN 50 MG RE SUPP
RECTAL | Status: AC
  Filled 2017-07-29: qty 2

## 2017-07-29 MED ORDER — INDOMETHACIN 50 MG RE SUPP
100.0000 mg | Freq: Once | RECTAL | Status: DC
  Filled 2017-07-29 (×2): qty 2

## 2017-07-29 MED ORDER — PHENYLEPHRINE 40 MCG/ML (10ML) SYRINGE FOR IV PUSH (FOR BLOOD PRESSURE SUPPORT)
PREFILLED_SYRINGE | INTRAVENOUS | Status: DC | PRN
  Administered 2017-07-29 (×2): 80 ug via INTRAVENOUS
  Administered 2017-07-29: 40 ug via INTRAVENOUS
  Administered 2017-07-29 (×2): 80 ug via INTRAVENOUS

## 2017-07-29 MED ORDER — PROPOFOL 10 MG/ML IV BOLUS
INTRAVENOUS | Status: AC
  Filled 2017-07-29: qty 20

## 2017-07-29 MED ORDER — FENTANYL CITRATE (PF) 100 MCG/2ML IJ SOLN
INTRAMUSCULAR | Status: DC | PRN
  Administered 2017-07-29: 50 ug via INTRAVENOUS

## 2017-07-29 MED ORDER — ENSURE ENLIVE PO LIQD: 237.0000 mL | Freq: Two times a day (BID) | ORAL | 12 refills | Status: AC

## 2017-07-29 MED ORDER — ONDANSETRON HCL 4 MG/2ML IJ SOLN
INTRAMUSCULAR | Status: DC | PRN
  Administered 2017-07-29: 4 mg via INTRAVENOUS

## 2017-07-29 MED ORDER — PIPERACILLIN-TAZOBACTAM IN DEX 2-0.25 GM/50ML IV SOLN: 2.2500 g | Freq: Three times a day (TID) | INTRAVENOUS | Status: AC

## 2017-07-29 MED ORDER — DEXAMETHASONE SODIUM PHOSPHATE 10 MG/ML IJ SOLN
INTRAMUSCULAR | Status: DC | PRN
  Administered 2017-07-29: 5 mg via INTRAVENOUS

## 2017-07-29 MED ORDER — PROPOFOL 10 MG/ML IV BOLUS
INTRAVENOUS | Status: DC | PRN
  Administered 2017-07-29: 100 mg via INTRAVENOUS

## 2017-07-29 MED ORDER — LIDOCAINE 2% (20 MG/ML) 5 ML SYRINGE
INTRAMUSCULAR | Status: DC | PRN
  Administered 2017-07-29: 50 mg via INTRAVENOUS

## 2017-07-29 MED ORDER — PANTOPRAZOLE SODIUM 40 MG PO TBEC: 40.0000 mg | DELAYED_RELEASE_TABLET | Freq: Two times a day (BID) | ORAL | Status: AC

## 2017-07-29 MED ORDER — GLUCAGON HCL RDNA (DIAGNOSTIC) 1 MG IJ SOLR
INTRAMUSCULAR | Status: DC | PRN
  Administered 2017-07-29 (×2): .5 mg via INTRAVENOUS

## 2017-07-29 MED ORDER — POTASSIUM CHLORIDE 10 MEQ/100ML IV SOLN
10.0000 meq | INTRAVENOUS | Status: AC
  Administered 2017-07-29: 10 meq via INTRAVENOUS
  Filled 2017-07-29 (×2): qty 100

## 2017-07-29 MED ORDER — POTASSIUM CHLORIDE 10 MEQ/100ML IV SOLN
10.0000 meq | INTRAVENOUS | Status: AC
  Administered 2017-07-29 (×2): 10 meq via INTRAVENOUS
  Filled 2017-07-29 (×2): qty 100

## 2017-07-29 MED ORDER — ROCURONIUM BROMIDE 50 MG/5ML IV SOSY
PREFILLED_SYRINGE | INTRAVENOUS | Status: DC | PRN
  Administered 2017-07-29: 40 mg via INTRAVENOUS

## 2017-07-29 MED ORDER — SUGAMMADEX SODIUM 200 MG/2ML IV SOLN
INTRAVENOUS | Status: DC | PRN
  Administered 2017-07-29: 110 mg via INTRAVENOUS

## 2017-07-29 MED ORDER — EPHEDRINE SULFATE-NACL 50-0.9 MG/10ML-% IV SOSY
PREFILLED_SYRINGE | INTRAVENOUS | Status: DC | PRN
  Administered 2017-07-29: 10 mg via INTRAVENOUS

## 2017-07-29 MED ORDER — PANTOPRAZOLE SODIUM 40 MG PO TBEC
40.0000 mg | DELAYED_RELEASE_TABLET | Freq: Two times a day (BID) | ORAL | Status: DC
  Filled 2017-07-29: qty 1

## 2017-07-29 MED ORDER — PHENYLEPHRINE HCL 10 MG/ML IJ SOLN
INTRAVENOUS | Status: DC | PRN
  Administered 2017-07-29: 100 ug/min via INTRAVENOUS

## 2017-07-29 MED ORDER — SODIUM CHLORIDE 0.9 % IV SOLN
INTRAVENOUS | Status: DC
  Administered 2017-07-29: 09:00:00 via INTRAVENOUS

## 2017-07-29 MED ORDER — INDOMETHACIN 50 MG RE SUPP
RECTAL | Status: DC | PRN
  Administered 2017-07-29: 100 mg via RECTAL

## 2017-07-29 MED ORDER — ENSURE ENLIVE PO LIQD: 237.0000 mL | Freq: Two times a day (BID) | ORAL | Status: DC

## 2017-07-29 MED ORDER — BELLADONNA ALKALOIDS-OPIUM 16.2-60 MG RE SUPP
1.0000 | Freq: Four times a day (QID) | RECTAL | Status: DC | PRN
  Administered 2017-07-29: 1 via RECTAL
  Filled 2017-07-29: qty 1

## 2017-07-29 MED ORDER — GLUCAGON HCL RDNA (DIAGNOSTIC) 1 MG IJ SOLR
INTRAMUSCULAR | Status: AC
  Filled 2017-07-29: qty 1

## 2017-07-29 MED ORDER — OXYBUTYNIN CHLORIDE 5 MG/5ML PO SYRP
5.0000 mg | ORAL_SOLUTION | Freq: Two times a day (BID) | ORAL | Status: DC | PRN
  Filled 2017-07-29: qty 5

## 2017-07-29 MED ORDER — POTASSIUM CHLORIDE 10 MEQ/100ML IV SOLN: 10.0000 meq | Freq: Once | INTRAVENOUS | Status: DC

## 2017-07-29 MED ORDER — POTASSIUM CHLORIDE 10 MEQ/100ML IV SOLN
10.0000 meq | INTRAVENOUS | Status: DC
  Administered 2017-07-29: 10 meq via INTRAVENOUS

## 2017-07-29 MED ORDER — FENTANYL CITRATE (PF) 100 MCG/2ML IJ SOLN
INTRAMUSCULAR | Status: AC
  Filled 2017-07-29: qty 2

## 2017-07-29 NOTE — Transfer of Care (Signed)
Immediate Anesthesia Transfer of Care Note  Patient: Mathew Waller  Procedure(s) Performed: ENDOSCOPIC RETROGRADE CHOLANGIOPANCREATOGRAPHY (ERCP) (N/A )  Patient Location: Endoscopy Unit  Anesthesia Type:General  Level of Consciousness: drowsy and patient cooperative  Airway & Oxygen Therapy: Patient Spontanous Breathing and Patient connected to face mask oxygen  Post-op Assessment: Report given to RN and Post -op Vital signs reviewed and stable  Post vital signs: Reviewed and stable  Last Vitals:  Vitals Value Taken Time  BP 168/79 07/29/2017  1:50 PM  Temp    Pulse 111 07/29/2017  1:52 PM  Resp 24 07/29/2017  1:52 PM  SpO2 100 % 07/29/2017  1:52 PM  Vitals shown include unvalidated device data.  Last Pain:  Vitals:   07/29/17 1119  TempSrc: Oral  PainSc:          Complications: No apparent anesthesia complications

## 2017-07-29 NOTE — Progress Notes (Signed)
Attempt to insert 16 Fr Foley catheter met with resistance and unable to advance into bladder. Received order to attempt a Coude 16 Fr catheter and still unable to advance to bladder. Condom cath applied and pt sent to Endo for procedure. Eulas Post, RN

## 2017-07-29 NOTE — Progress Notes (Signed)
Transfer report called to Levada Dy, RN at [facility]. Awaiting transportation arranged with [place]. Eulas Post, RN

## 2017-07-29 NOTE — Op Note (Addendum)
[facility] Patient Name: Mathew Waller Procedure Date: 07/29/2017 MRN: [id number] Attending MD: Estill Cotta. Loletha Carrow , MD Date of Birth: 1921/11/25 CSN: [id number] Age: 84 Admit Type: Inpatient Procedure:                ERCP Indications:              Bile duct stone(s), Elevated liver enzymes Providers:                Mallie Mussel L. Loletha Carrow, MD, Carolynn Comment RN, RN, Cherylynn Ridges, Technician, Cathe Mons, CRNA Referring MD:              Medicines:                General Anesthesia, Indomethacin 100 mg PR,                            standing dose zosyn Complications:            No immediate complications. Estimated Blood Loss:     Estimated blood loss: none. Procedure:                Pre-Anesthesia Assessment:                           - Prior to the procedure, a History and Physical                            was performed, and patient medications and                            allergies were reviewed. The patient's tolerance of                            previous anesthesia was also reviewed. The risks                            and benefits of the procedure and the sedation                            options and risks were discussed with the patient.                            All questions were answered, and informed consent                            was obtained. Prior Anticoagulants: The patient has                            taken no previous anticoagulant or antiplatelet                            agents. ASA Grade Assessment: III - A patient with  severe systemic disease. After reviewing the risks                            and benefits, the patient was deemed in                            satisfactory condition to undergo the procedure.                           After obtaining informed consent, the scope was                            passed under direct vision. Throughout the                            procedure,  the patient's blood pressure, pulse, and                            oxygen saturations were monitored continuously. The                            [id number] ( [id number] ) was introduced through the                            mouth, and used to inject contrast into and used to                            inject contrast into the ventral pancreatic duct.                            The EG-[id number] ([id number]) scope was introduced through                            the mouth, and used to inject contrast into. The                            ERCP was performed with difficulty due to                            challenging cannulation. The patient tolerated the                            procedure well. Scope In: Scope Out: Findings:      The scout film was normal. A standard esophagogastroduodenoscopy scope       was used for the examination of the upper gastrointestinal tract. The       scope was passed under direct vision through the upper GI tract. LA       Grade D (one or more mucosal breaks involving at least 75% of esophageal       circumference) esophagitis with no bleeding was found in the entire       esophagus. The entire examined stomach was normal. An acquired       benign-appearing, intrinsic moderate stenosis was found in the duodenal  bulb and was traversed before dilation. A TTS dilator was passed through       the scope to aid passage of the duodenoscope . Dilation with a       12-13.5-15 mm balloon dilator was performed in the duodenum.      The EGD scope was removed, and the duodenoscope was passed to the major       papilla. An 0.035 inch x 260 cm straight Hydra Jagwire was passed       inadvertently into the ventral pancreatic duct several times. No       contrast was injected. This wire was left in place, and a sphincterotome       with a wire was passed alongside that and used to attempt biliary       cannulation. This was unsuccessful, so one 4 Fr by 3 cm plastic stent        with a full external pigtail and a single internal flap was placed into       the ventral pancreatic duct. Clear fluid flowed through the stent. The       stent was in good position. Further attempts to cannulate the bile duct       with a smaller diameter sphincterotome and wire were also unsuccessful.       The pancreatic duct stent was left in place and the procedure terminated. Impression:               - LA Grade D reflux esophagitis.                           - Normal stomach.                           - Acquired duodenal stenosis.                           - Dilation performed in the duodenum.                           - One plastic stent was placed into the ventral                            pancreatic duct. Moderate Sedation:      GETA Recommendation:           - Clear liquid diet.                           - Transfer to tertiary care center for repeat ERCP                            by advanced biliary endoscopist.                           Twice daily oral PPI an elevate HOB at least 30                            degrees at all times. Procedure Code(s):        --- Professional ---  24235, Endoscopic retrograde                            cholangiopancreatography (ERCP); with placement of                            endoscopic stent into biliary or pancreatic duct,                            including pre- and post-dilation and guide wire                            passage, when performed, including sphincterotomy,                            when performed, each stent                           43245, Esophagogastroduodenoscopy, flexible,                            transoral; with dilation of gastric/duodenal                            stricture(s) (eg, balloon, bougie) Diagnosis Code(s):        --- Professional ---                           K21.0, Gastro-esophageal reflux disease with                            esophagitis                            K31.5, Obstruction of duodenum                           K80.50, Calculus of bile duct without cholangitis                            or cholecystitis without obstruction                           R74.8, Abnormal levels of other serum enzymes CPT copyright 2017 American Medical Association. All rights reserved. The codes documented in this report are preliminary and upon coder review may  be revised to meet current compliance requirements. Henry L. Loletha Carrow, MD 07/29/2017 1:53:00 PM This report has been signed electronically. Number of Addenda: 0

## 2017-07-29 NOTE — Progress Notes (Signed)
[facility] Progress Note     Subjective: CC:  No new complaints. Son at bedside  Objective: Vital signs in last 24 hours: Temp:  [97.2 F (36.2 C)-98.2 F (36.8 C)] 97.8 F (36.6 C) (06/05 0845) Pulse Rate:  [93-104] 98 (06/05 0845) Resp:  [12-22] 19 (06/05 0845) BP: (132-152)/(59-77) 152/77 (06/05 0845) SpO2:  [97 %-100 %] 99 % (06/05 0845)    Intake/Output from previous day: 06/04 0701 - 06/05 0700 In: 2313 [P.O.:95; I.V.:2118; IV Piggyback:100] Out: 350 [Urine:350] Intake/Output this shift: No intake/output data recorded.  PE: Gen:  Alert, pleasantly demented, NAD Card:  Regular rate and rhythm, pedal pulses 2+ BL Pulm:  Normal effort, clear to auscultation bilaterally Abd: Soft, overall non-tender, mild distention, no peritonitis, no HSM Skin: warm and dry, no rashes  Psych: A&Ox3     Lab Results:  Recent Labs    07/28/17 0440 07/29/17 0851  WBC 15.4* 16.8*  HGB 13.6 14.2  HCT 41.5 42.3  PLT 756* 671*   BMET Recent Labs    07/28/17 0749 07/29/17 0755  NA 148* 148*  K 3.4* 3.0*  CL 112* 103  CO2 24 32  GLUCOSE 134* 140*  BUN 66* 66*  CREATININE 2.33* 2.44*  CALCIUM 9.4 8.9   PT/INR Recent Labs    07/29/17 0358  LABPROT 15.8*  INR 1.27   CMP     Component Value Date/Time   NA 148 (H) 07/29/2017 0755   NA 141 12/16/2011 1416   K 3.0 (L) 07/29/2017 0755   K 4.3 12/16/2011 1416   CL 103 07/29/2017 0755   CL 105 12/16/2011 1416   CO2 32 07/29/2017 0755   CO2 27 12/16/2011 1416   GLUCOSE 140 (H) 07/29/2017 0755   GLUCOSE 100 (H) 12/16/2011 1416   BUN 66 (H) 07/29/2017 0755   BUN 20.0 12/16/2011 1416   CREATININE 2.44 (H) 07/29/2017 0755   CREATININE 1.4 (H) 12/16/2011 1416   CALCIUM 8.9 07/29/2017 0755   CALCIUM 10.1 12/16/2011 1416   PROT 5.9 (L) 07/29/2017 0755   PROT 7.5 12/16/2011 1416   ALBUMIN 2.1 (L) 07/29/2017 0755   ALBUMIN 3.7 12/16/2011 1416   AST 120 (H) 07/29/2017 0755   AST 15 12/16/2011 1416   ALT  126 (H) 07/29/2017 0755   ALT 15 12/16/2011 1416   ALKPHOS 282 (H) 07/29/2017 0755   ALKPHOS 99 12/16/2011 1416   BILITOT 3.5 (H) 07/29/2017 0755   BILITOT 0.40 12/16/2011 1416   GFRNONAA 21 (L) 07/29/2017 0755   GFRAA 25 (L) 07/29/2017 0755   Lipase  No results found for: LIPASE     Studies/Results: No results found.  Anti-infectives: Anti-infectives (From admission, onward)   Start     Dose/Rate Route Frequency Ordered Stop   07/25/17 2200  piperacillin-tazobactam (ZOSYN) IVPB 2.25 g     2.25 g 100 mL/hr over 30 Minutes Intravenous Every 8 hours 07/25/17 2107     07/25/17 2030  cefTRIAXone (ROCEPHIN) 1 g in sodium chloride 0.9 % 100 mL IVPB  Status:  Discontinued     1 g 200 mL/hr over 30 Minutes Intravenous Every 24 hours 07/25/17 2024 07/25/17 2045       Assessment/Plan Advanced age Dementia Elevated troponin Hypokalemia   94MG allstones with signs of chronic cholecystitis without acute exacerbation - Patient may still benefit from HIDA scan to confirm chronic cholecystitis. If cholecystitis confirmed, would recommend percutaneous drainage of the gallbaldder rather than lap chole.   Elevated liver function studies and  dilated CBD -  Persistent hyperbilirubinemia and elevation of LFTs, ERCP today by Dr. Loletha Carrow. Leukocytosis 16.8 from 15  FEN - CLD ID - Zosyn VTE - SCD's   Plan - follow results ERCP     LOS: 4 days    Jill Alexanders , [facility] Surgery 07/29/2017, 9:19 AM Pager: [phone] Consults: [phone] Mon-Fri 7:00 am-4:30 pm Sat-Sun 7:00 am-11:30 am

## 2017-07-29 NOTE — Progress Notes (Signed)
SLP Cancellation Note  Patient Details Name: Mathew Waller MRN: [id number] DOB: [birth year]   Cancelled treatment:       Reason Eval/Treat Not Completed: Patient at procedure or test/unavailable(order for swallow eval received, pt npo for procedure, will continue efforts)   Macario Golds 07/29/2017, 10:26 AM  [name], [facility] SLP [phone]

## 2017-07-29 NOTE — Anesthesia Preprocedure Evaluation (Addendum)
Anesthesia Evaluation  Patient identified by MRN, date of birth, ID band Patient awake    Reviewed: Allergy & Precautions, NPO status , Patient's Chart, lab work & pertinent test results, reviewed documented beta blocker date and time   Airway Mallampati: III       Dental no notable dental hx. (+) Upper Dentures, Lower Dentures   Pulmonary former smoker,    Pulmonary exam normal breath sounds clear to auscultation       Cardiovascular hypertension, Pt. on medications and Pt. on home beta blockers Normal cardiovascular exam Rhythm:Regular Rate:Normal     Neuro/Psych PSYCHIATRIC DISORDERS Dementia    GI/Hepatic Neg liver ROS, Elevated LFT's  Dilated CBD Possible choledocholithiasis Cholelithiasis with chronic cholecystitis   Endo/Other  Malnutrition Hyperlipidemia  Renal/GU Renal InsufficiencyRenal disease  negative genitourinary   Musculoskeletal negative musculoskeletal ROS (+)   Abdominal   Peds  Hematology Thrombocytosis   Anesthesia Other Findings   Reproductive/Obstetrics                          Anesthesia Physical Anesthesia Plan  ASA: III  Anesthesia Plan: General   Post-op Pain Management:    Induction: Intravenous  PONV Risk Score and Plan: 2 and Ondansetron, Treatment may vary due to age or medical condition and Dexamethasone  Airway Management Planned: Oral ETT  Additional Equipment:   Intra-op Plan:   Post-operative Plan: Extubation in OR  Informed Consent: I have reviewed the patients History and Physical, chart, labs and discussed the procedure including the risks, benefits and alternatives for the proposed anesthesia with the patient or authorized representative who has indicated his/her understanding and acceptance.   Dental advisory given  Plan Discussed with: CRNA, Anesthesiologist and Surgeon  Anesthesia Plan Comments:         Anesthesia Quick  Evaluation

## 2017-07-29 NOTE — Anesthesia Postprocedure Evaluation (Signed)
Anesthesia Post Note  Patient: Mathew Waller  Procedure(s) Performed: ENDOSCOPIC RETROGRADE CHOLANGIOPANCREATOGRAPHY (ERCP) (N/A )     Patient location during evaluation: PACU Anesthesia Type: General Level of consciousness: awake Pain management: pain level controlled Vital Signs Assessment: post-procedure vital signs reviewed and stable Respiratory status: spontaneous breathing, nonlabored ventilation and respiratory function stable Cardiovascular status: blood pressure returned to baseline and stable Postop Assessment: no apparent nausea or vomiting Anesthetic complications: no    Last Vitals:  Vitals:   07/29/17 1351 07/29/17 1400  BP: (!) 168/79 (!) 159/82  Pulse: (!) 112 (!) 110  Resp: (!) 26 18  Temp: 36.5 C   SpO2: 100% 100%    Last Pain:  Vitals:   07/29/17 1351  TempSrc: Oral  PainSc: 0-No pain                 Azjah Pardo A.

## 2017-07-29 NOTE — Interval H&P Note (Signed)
History and Physical Interval Note:  07/29/2017 11:32 AM  Mathew Waller  has presented today for surgery, with the diagnosis of Elevated LFT's, Dilated common bile duct  The various methods of treatment have been discussed with the patient and family. After consideration of risks, benefits and other options for treatment, the patient has consented to  Procedure(s): ENDOSCOPIC RETROGRADE CHOLANGIOPANCREATOGRAPHY (ERCP) (N/A) as a surgical intervention .  The patient's history has been reviewed, patient examined, no change in status, stable for surgery.  I have reviewed the patient's chart and labs.  Questions were answered to the patient's satisfaction.     Nelida Meuse III

## 2017-07-29 NOTE — Anesthesia Procedure Notes (Signed)
Procedure Name: Intubation Date/Time: 07/29/2017 11:44 AM Performed by: Montel Clock, CRNA Pre-anesthesia Checklist: Patient identified, Emergency Drugs available, Suction available, Patient being monitored and Timeout performed Patient Re-evaluated:Patient Re-evaluated prior to induction Oxygen Delivery Method: Circle system utilized Preoxygenation: Pre-oxygenation with 100% oxygen Induction Type: IV induction Ventilation: Mask ventilation without difficulty, Oral airway inserted - appropriate to patient size and Two handed mask ventilation required Laryngoscope Size: Mac and 3 Grade View: Grade II Tube type: Oral Tube size: 7.5 mm Number of attempts: 1 Airway Equipment and Method: Stylet Placement Confirmation: ETT inserted through vocal cords under direct vision,  positive ETCO2 and breath sounds checked- equal and bilateral Secured at: 23 cm Tube secured with: Tape Dental Injury: Teeth and Oropharynx as per pre-operative assessment  Comments: 2 hand mask for seal.

## 2017-07-29 NOTE — Progress Notes (Signed)
SLP Cancellation Note  Patient Details Name: Bearl Talarico MRN: [id number] DOB: 1921-06-14   Cancelled treatment:       Reason Eval/Treat Not Completed: Other (comment)(pt npo for procedure, will continue efforts)   Macario Golds 07/29/2017, 8:15 AM [name], [place] [facility] SLP [phone]

## 2017-07-29 NOTE — Discharge Summary (Addendum)
Physician Discharge Summary  [name] TDD:[id number] DOB: 19-Dec-1921 DOA: 07/25/2017  PCP: [name], MD (Inactive)  Admit date: 07/25/2017 Discharge date: 07/29/2017  Admitted From: Home Disposition: Transfer to [facility] to the care of Dr. Wenda Overland  Recommendations for Outpatient Follow-up:  1. Further Care per [facility]  2. Please obtain CMP/CBC, Mag, Phos in AM 3. Continue SLP Evaluation at [facility]  4. Please follow up on the following pending results:  Home Health: No Equipment/Devices: None    Discharge Condition: Stable  CODE STATUS: FULL CODE Diet recommendation: Currently on Clear Liquid Diet but further care per SLP evaluation   Brief/Interim Summary: Mathew Waller a 84 y.o.malewithhistory of dementia, hypertension, and other comorbids who presented to the ER with complaints of persistent nausea and vomiting over the last 4 days. History was obtained from son in ED.  He was uable to take his medications due to the vomitingand also had increased pain in the right hand with difficult to make fist. Labwork revealed LFTS noted to be elevated, Ultrasound revealed gallstones and CBD dilated to 8 mm. BP was as high as 205/86. He was admitted for his Abdominal pain and worked up. General Surgery and Gastroenterology were consulted for further evaluation. Patient was unable to tolerate MRCP so ERCP was done today and was unsuccessful. GI recommended transfer to a [facility] for repeat ERCP by an advanced Biliary Endoscopist. Patient was accepted at [facility] and will be transferred to the care of Dr. Wenda Overland.  Discharge Diagnoses:  Principal Problem:   Nausea & vomiting Active Problems:   ARF (acute renal failure) (HCC)   Hypertensive urgency   Cholelithiasis   Protein-calorie malnutrition, severe  Nausea & vomiting with elevated LFTs/T BIli and CBD dilatation and Cholelithiasis and suspected  Choledocholithiasis Leukocytosis -Unable to Tolerate MRCP due to Agitation so went for ERCP today -ERCP as below and unsuccessful; Will need advanced Biliary Specialist -He does not seem to have pain, vomiting or fevers in hospital-  -On IV Zosyn -Started clear liquids but not eating or drinking  -GI and General Surgery Following -Will transfer to [facility] at the recommendations of Dr. Wilfrid Lund as patient needs advanced Biliary Endoscopist -Patient Accepted at [facility] and will be transferred to the care of Dr. Wenda Overland   ARF (acute renal failure) but likely in the setting of CKD Metabolic Acidosis  -Last Cr in 2013 was 1.4 -Has been hydrated -Cr 2.52>> 2.04> 2.12- cont IVF- change to D5 with 2 amp Bicarb due to acidosis -6/5-  Cr sill 2.37 today- likely chronic kidney disease; Continue To Follow  -Acidosis improved on Bicarb- cont to follow  ? Swallowing issues/Dysphagia  -Tried to drink yesterday but could not swallow and had to spit it out -SLP Evaluation still pending but GI Placed patient on Clear Liquid Diet -Will need SLP evaluation at [facility]   Dementia -Mostly non-verbal  -Follows some commands -Delirium Precautions  Acute bronchitis? - 6/3- coughed up yellow mucous today- CXR on admission negative - on Zosyn  Hypertensive Urgency -Due to inability to take Norvasc, Hydralazine and Toprol at home (vomiting) -Currently on Hydralazine 10 mg QID, Lopressor 5mg  Q6 hrs -BP improved-- check vitals Q4.   Elevated Troponin -Likely in the setting of elevated BP and HTN Urgency   Right hand pain? -6/2- no pain & no swelling in hand -Uric acid is 11.4 -Xray done in ED unrevealing -Continue to Monitor and will  need OT Evaluation   Hypokalemia -Patient's K+ was 3.1 -Replete with IV KCl -Continue to Monitor and Replete as Necessary   HyperNatremia -Improving slightly -Na+ Went from 150 -> 147 -C/w IVF Rehydration as above -Repeat CMP in AM    Severe malnutrition in the context of chronic illness -Nutrition is consulted and appreciate evaluation recommendations -We will add Ensure Enlive p.o. twice daily once diet is advanced  Thrombocytosis -Likely Reactive -Trending down -Continue to Monitor and Evaluate and repeat CBC in AM   Acute Urinary Retention -Patient had greater than 300 mL's in his bladder -Place Foley catheter -Use oxybutynin for bladder spasms -Will need outpatient follow-up with urology for trial of void  Discharge Instructions  Discharge Instructions    Call MD for:  difficulty breathing, headache or visual disturbances   Complete by:  As directed    Call MD for:  extreme fatigue   Complete by:  As directed    Call MD for:  hives   Complete by:  As directed    Call MD for:  persistant dizziness or light-headedness   Complete by:  As directed    Call MD for:  persistant nausea and vomiting   Complete by:  As directed    Call MD for:  redness, tenderness, or signs of infection (pain, swelling, redness, odor or green/yellow discharge around incision site)   Complete by:  As directed    Call MD for:  severe uncontrolled pain   Complete by:  As directed    Call MD for:  temperature >100.4   Complete by:  As directed    Diet - low sodium heart healthy   Complete by:  As directed    CLEAR LIQUID DIET   Discharge instructions   Complete by:  As directed    Follow-up care at [facility] under Dr. Wenda Overland.  Patient will undergo repeat ERCP with a advanced biliary endoscopist.   Increase activity slowly   Complete by:  As directed      Allergies as of 07/29/2017   No Known Allergies     Medication List    TAKE these medications   amLODipine 10 MG tablet Commonly known as:  NORVASC Take 10 mg by mouth daily.   feeding supplement (ENSURE ENLIVE) Liqd Take 237 mLs by mouth 2 (two) times daily between meals.   hydrALAZINE 25 MG tablet Commonly known as:  APRESOLINE Take 25 mg by  mouth 2 (two) times daily.   metoprolol succinate 100 MG 24 hr tablet Commonly known as:  TOPROL-XL Take 100 mg by mouth daily.   pantoprazole 40 MG tablet Commonly known as:  PROTONIX Take 1 tablet (40 mg total) by mouth 2 (two) times daily before a meal.   piperacillin-tazobactam 2-0.25 GM/50ML IVPB Commonly known as:  ZOSYN Inject 50 mLs (2.25 g total) into the vein every 8 (eight) hours.   trolamine salicylate 10 % cream Commonly known as:  ASPERCREME Apply 1 application topically as needed for muscle pain.       No Known Allergies  Consultations:  Gastroenterology Dr. Wilfrid Lund  General Surgery Dr. Ralene Ok  Procedures/Studies: US Abdomen Complete  Result Date: 07/25/2017 CLINICAL DATA:  Acute renal failure. Abdominal pain. Vomiting. Anorexia. EXAM: ABDOMEN ULTRASOUND COMPLETE COMPARISON:  None. FINDINGS: Gallbladder: There is a 1.7 cm calcified shadowing gallstone in the contracted gallbladder. Apparent mild diffuse gallbladder wall thickening. No pericholecystic fluid. No sonographic Murphy's sign. Common bile duct: Diameter: 8 mm, mildly dilated.  No choledocholithiasis demonstrated, noting nonvisualization of the distal common bile duct due to overlying bowel gas. Liver: Liver parenchyma is mildly heterogeneous, with normal echogenicity. No liver mass. Portal vein is patent on color Doppler imaging with normal direction of blood flow towards the liver. IVC: No abnormality visualized. Pancreas: Visualized portion unremarkable, with top-normal main pancreatic duct. Spleen: Size and appearance within normal limits. Right Kidney: Length: 9.7 cm. Echogenic right renal parenchyma. No right hydronephrosis. No right renal mass. Left Kidney: Length: 10.1 cm. Echogenic left renal parenchyma. No left hydronephrosis. No left renal mass. Abdominal aorta: Atherosclerotic abdominal aorta with ectatic 2.7 cm infrarenal abdominal aorta. Other findings: Small volume upper abdominal  ascites. IMPRESSION: 1. Cholelithiasis. Contracted gallbladder. Apparent mild diffuse gallbladder wall thickening. No pericholecystic fluid. No sonographic Murphy's sign. Sonographic findings are most compatible with chronic calculous cholecystitis. 2. Mildly dilated common bile duct (8 mm diameter). No choledocholithiasis demonstrated, noting nonvisualization of the distal CBD due to overlying bowel gas. Recommend correlation with serum bilirubin levels. Consider MRI abdomen with MRCP without and with IV contrast for further evaluation, as clinically warranted. 3. Nonspecific mild liver parenchymal heterogeneity, cannot exclude hepatocellular disease. No liver mass. 4. Echogenic kidneys, indicative of nonspecific renal parenchymal disease of uncertain chronicity. No hydronephrosis. 5. Ectatic 2.7 cm infrarenal abdominal aorta, at risk for aneurysm development. Recommend follow-up aortic ultrasound in 5 years. This recommendation follows ACR consensus guidelines: White Paper of the ACR Incidental Findings Committee II on Vascular Findings. J Am Coll Radiol 2013; 10:789-794. Electronically Signed   By: Ilona Sorrel M.D.   On: 07/25/2017 19:45   Dg Chest Port 1 View  Result Date: 07/26/2017 CLINICAL DATA:  Nausea and vomiting last night. EXAM: PORTABLE CHEST 1 VIEW COMPARISON:  Chest x-ray dated 06/17/2006. FINDINGS: Study is slightly hypoinspiratory with crowding of the bibasilar bronchovascular markings. Given the slightly low lung volumes, lungs are clear. No confluent opacity to suggest a developing pneumonia. No pleural effusion or pneumothorax seen. Heart size and mediastinal contours are stable. Atherosclerotic changes noted at the aortic arch. No acute or suspicious osseous finding. IMPRESSION: 1.  No active disease.  No evidence of pneumonia or pulmonary edema. 2.   Aortic atherosclerosis. Electronically Signed   By: Franki Cabot M.D.   On: 07/26/2017 07:19   Dg Ercp  Result Date: 07/29/2017 CLINICAL  DATA:  Elevated liver function tests EXAM: ERCP TECHNIQUE: Multiple spot images obtained with the fluoroscopic device and submitted for interpretation post-procedure. FLUOROSCOPY TIME:  Fluoroscopy Time:  2 minutes and 23 seconds Radiation Exposure Index (if provided by the fluoroscopic device): 31.15 mGy Number of Acquired Spot Images: 0 COMPARISON:  None. FINDINGS: The single spot image demonstrates the endoscope in the descending duodenum. IMPRESSION: See above. These images were submitted for radiologic interpretation only. Please see the procedural report for the amount of contrast and the fluoroscopy time utilized. Electronically Signed   By: Marybelle Killings M.D.   On: 07/29/2017 14:45   Dg Hand Complete Right  Result Date: 07/25/2017 CLINICAL DATA:  Hand pain and weakness, no known injury, initial encounter EXAM: RIGHT HAND - COMPLETE 3+ VIEW COMPARISON:  None. FINDINGS: Examination is somewhat limited secondary to patient's inability to straighten is fingers. Mild osteopenia is seen. No definitive acute fracture is seen. Diffuse vascular calcifications are noted. IMPRESSION: No acute abnormality noted. Electronically Signed   By: Inez Catalina M.D.   On: 07/25/2017 16:56   EGD Findings:      The scout film was normal. A  standard esophagogastroduodenoscopy scope       was used for the examination of the upper gastrointestinal tract. The       scope was passed under direct vision through the upper GI tract. LA       Grade D (one or more mucosal breaks involving at least 75% of esophageal       circumference) esophagitis with no bleeding was found in the entire       esophagus. The entire examined stomach was normal. An acquired       benign-appearing, intrinsic moderate stenosis was found in the duodenal       bulb and was traversed before dilation. A TTS dilator was passed through       the scope to aid passage of the duodenoscope . Dilation with a       12-13.5-15 mm balloon dilator was performed  in the duodenum.      The EGD scope was removed, and the duodenoscope was passed to the major       papilla. An 0.035 inch x 260 cm straight Hydra Jagwire was passed       inadvertently into the ventral pancreatic duct several times. No       contrast was injected. This wire was left in place, and a sphincterotome       with a wire was passed alongside that and used to attempt biliary       cannulation. This was unsuccessful, so one 4 Fr by 3 cm plastic stent       with a full external pigtail and a single internal flap was placed into       the ventral pancreatic duct. Clear fluid flowed through the stent. The       stent was in good position. Further attempts to cannulate the bile duct       with a smaller diameter sphincterotome and wire were also unsuccessful.       The pancreatic duct stent was left in place and the procedure terminated. Impression:               - LA Grade D reflux esophagitis.                           - Normal stomach.                           - Acquired duodenal stenosis.                           - Dilation performed in the duodenum.                           - One plastic stent was placed into the ventral                            pancreatic duct.  Subjective:  Discharge Exam: Vitals:   07/29/17 1420 07/29/17 1436  BP: (!) 153/67 (!) 165/84  Pulse:  (!) 109  Resp:  16  Temp:    SpO2:  98%   Vitals:   07/29/17 1400 07/29/17 1410 07/29/17 1420 07/29/17 1436  BP: (!) 159/82 (!) 170/83 (!) 153/67 (!) 165/84  Pulse: (!) 110 (!) 116  (!) 109  Resp: 18 (!) 26  16  Temp:      TempSrc:      SpO2: 100% 97%  98%  Weight:      Height:       General: Pt is awake but mainly non-verbal but does follow some commands, not in acute distress  Cardiovascular: RRR, S1/S2 +, no rubs, no gallops Respiratory: Diminished bilaterally, no wheezing, no rhonchi Abdominal: Soft, Slightly tender to palpate in RUQ, ND, bowel sounds + Extremities: no edema, no  cyanosis  The results of significant diagnostics from this hospitalization (including imaging, microbiology, ancillary and laboratory) are listed below for reference.    Microbiology: No results found for this or any previous visit (from the past 240 hour(s)).   Labs: BNP (last 3 results) No results for input(s): BNP in the last 8760 hours. Basic Metabolic Panel: Recent Labs  Lab 07/27/17 0818 07/27/17 1507 07/28/17 0749 07/29/17 0755 07/29/17 0851  NA 148* 150* 148* 148* 147*  K 5.1 4.3 3.4* 3.0* 3.1*  CL 114* 116* 112* 103 103  CO2 20* 20* 24 32 32  GLUCOSE 110* 110* 134* 140* 140*  BUN 60* 63* 66* 66* 66*  CREATININE 2.12* 2.24* 2.33* 2.44* 2.37*  CALCIUM 9.6 9.5 9.4 8.9 8.9  MG  --   --   --   --  2.1  PHOS  --   --   --   --  2.5   Liver Function Tests: Recent Labs  Lab 07/27/17 0818 07/27/17 1507 07/28/17 0749 07/29/17 0755 07/29/17 0851  AST 132* 121* 136* 120* 125*  ALT 143* 139* 140* 126* 130*  ALKPHOS 314* 292* 293* 282* 282*  BILITOT 3.9* 3.3* 3.1* 3.5* 3.4*  PROT 6.6 6.5 6.3* 5.9* 6.0*  ALBUMIN 2.7* 2.5* 2.4* 2.1* 2.2*   No results for input(s): LIPASE, AMYLASE in the last 168 hours. No results for input(s): AMMONIA in the last 168 hours. CBC: Recent Labs  Lab 07/25/17 1204 07/26/17 0423 07/28/17 0440 07/29/17 0851  WBC 13.0* 15.6* 15.4* 16.8*  NEUTROABS 10.7* 12.9*  --  13.1*  HGB 14.2 14.5 13.6 14.2  HCT 43.0 44.1 41.5 42.3  MCV 85.3 83.4 84.7 83.9  PLT 854* 762* 756* 671*   Cardiac Enzymes: Recent Labs  Lab 07/25/17 2342 07/26/17 0630  TROPONINI 0.06* 0.09*   BNP: Invalid input(s): POCBNP CBG: Recent Labs  Lab 07/28/17 0518 07/28/17 1151 07/28/17 1744 07/28/17 2350 07/29/17 0645  GLUCAP 129* 136* 138* 143* 137*   D-Dimer No results for input(s): DDIMER in the last 72 hours. Hgb A1c No results for input(s): HGBA1C in the last 72 hours. Lipid Profile No results for input(s): CHOL, HDL, LDLCALC, TRIG, CHOLHDL, LDLDIRECT  in the last 72 hours. Thyroid function studies No results for input(s): TSH, T4TOTAL, T3FREE, THYROIDAB in the last 72 hours.  Invalid input(s): FREET3 Anemia work up No results for input(s): VITAMINB12, FOLATE, FERRITIN, TIBC, IRON, RETICCTPCT in the last 72 hours. Urinalysis    Component Value Date/Time   COLORURINE AMBER (A) 07/25/2017 1540   APPEARANCEUR CLOUDY (A) 07/25/2017 1540   LABSPEC 1.021 07/25/2017 1540   PHURINE 5.0 07/25/2017 1540   GLUCOSEU NEGATIVE 07/25/2017 1540   HGBUR MODERATE (A) 07/25/2017 1540   BILIRUBINUR MODERATE (A) 07/25/2017 1540   KETONESUR 5 (A) 07/25/2017 1540   PROTEINUR 100 (A) 07/25/2017 1540   NITRITE POSITIVE (A) 07/25/2017 1540   LEUKOCYTESUR NEGATIVE 07/25/2017 1540   Sepsis Labs Invalid input(s): PROCALCITONIN,  WBC,  LACTICIDVEN Microbiology No results found for this or any previous visit (  from the past 240 hour(s)).  Time coordinating discharge: 35 minutes  SIGNED:  Kerney Elbe, DO [facility] 07/29/2017, 4:04 PM Pager [phone]  If 7PM-7AM, please contact night-coverage [email or web address] Password [email or web address]

## 2017-07-30 ENCOUNTER — Encounter (HOSPITAL_COMMUNITY): Payer: Self-pay | Admitting: Gastroenterology

## 2017-07-30 NOTE — Progress Notes (Addendum)
Pt discharged to [facility] via [facility]  transportation in stable condition. Report given to transportation staff. Pt d/ced with IV and foley in place.

## 2017-08-24 DEATH — deceased

## 2020-05-22 IMAGING — US US ABDOMEN COMPLETE
1 series · 13 of 25 positions shown · non-contrast
Comparison: None.

CLINICAL DATA: Acute renal failure. Abdominal pain. Vomiting.
Anorexia.

EXAM:
ABDOMEN ULTRASOUND COMPLETE

[Series 1: us abdomen complete · 13 of 72 slices shown]
[im 1/72]
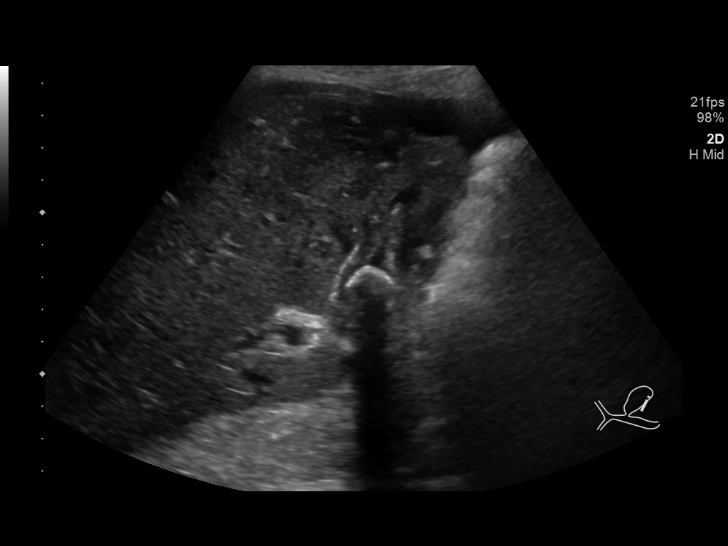
[im 6/72]
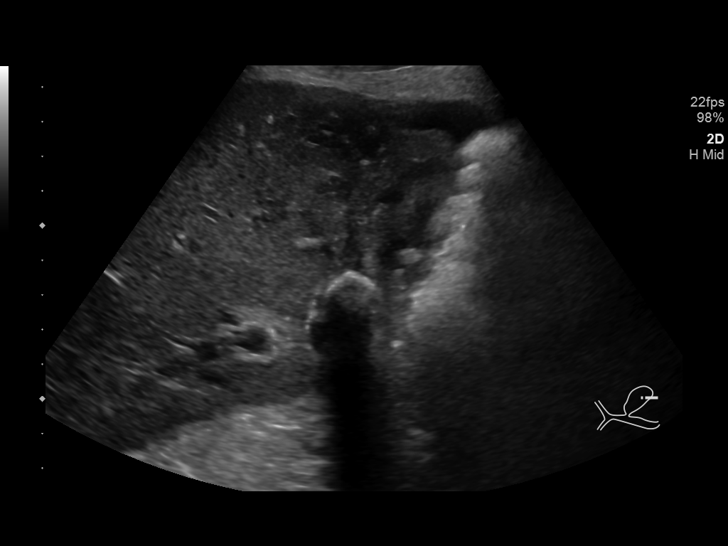
[im 12/72]
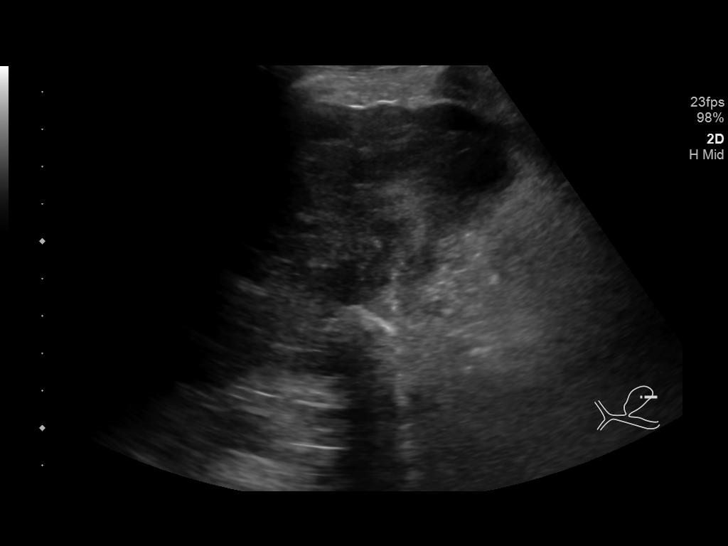
[im 18/72]
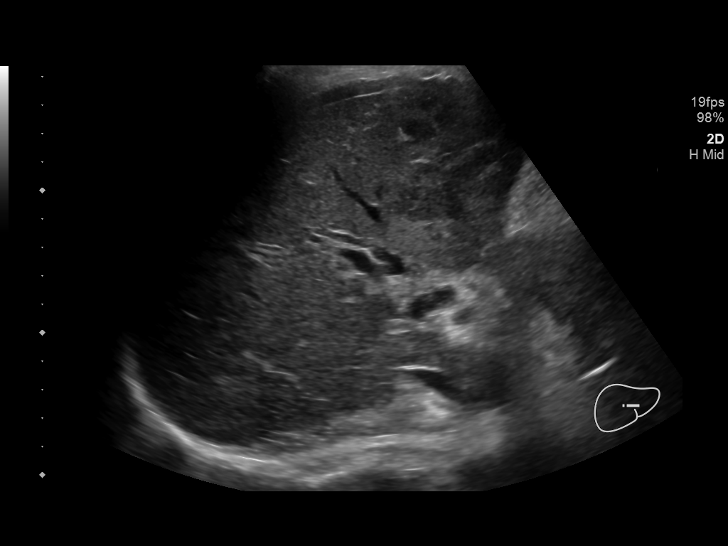
[im 24/72]
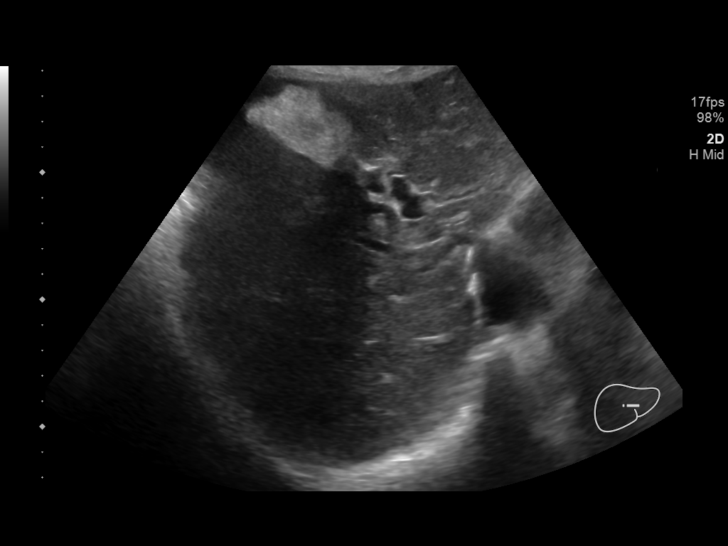
[im 30/72]
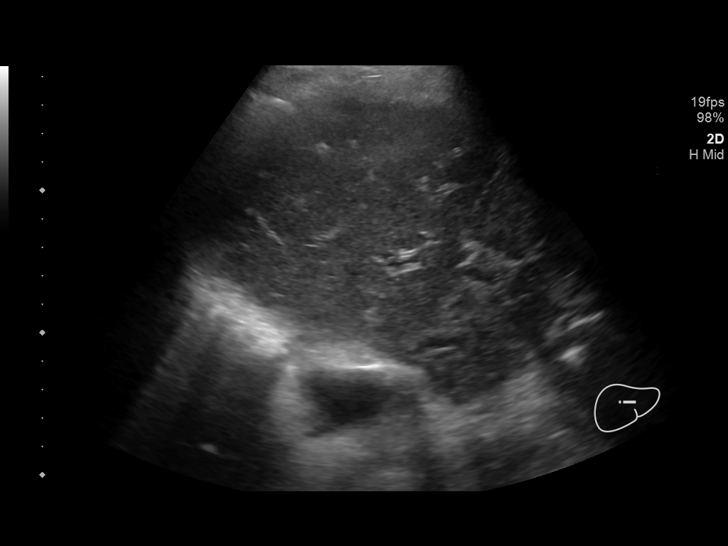
[im 36/72]
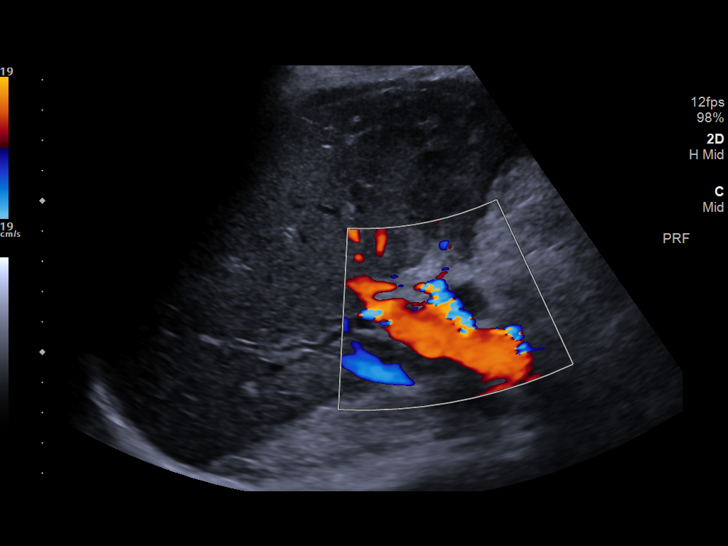
[im 42/72]
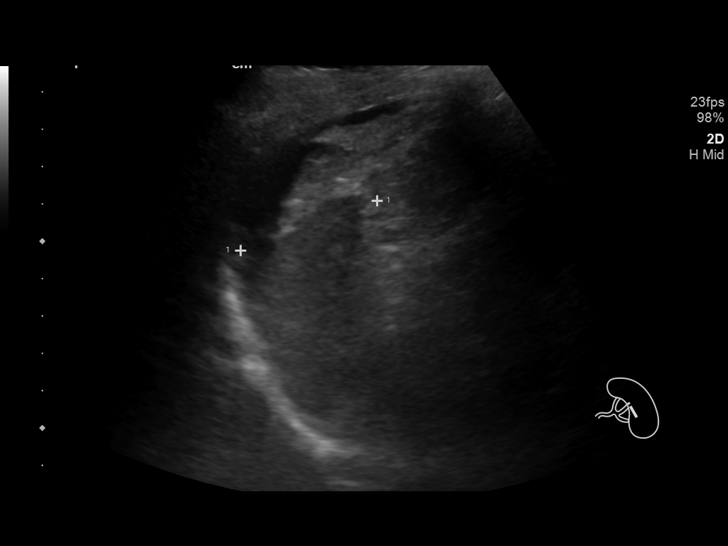
[im 48/72]
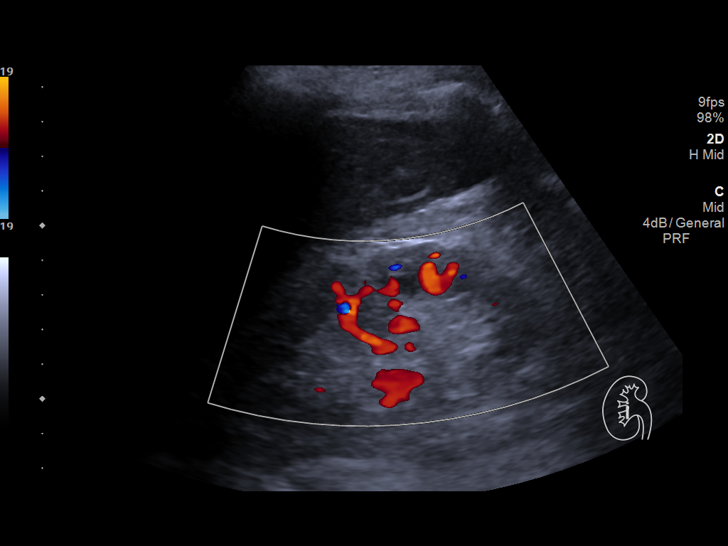
[im 54/72]
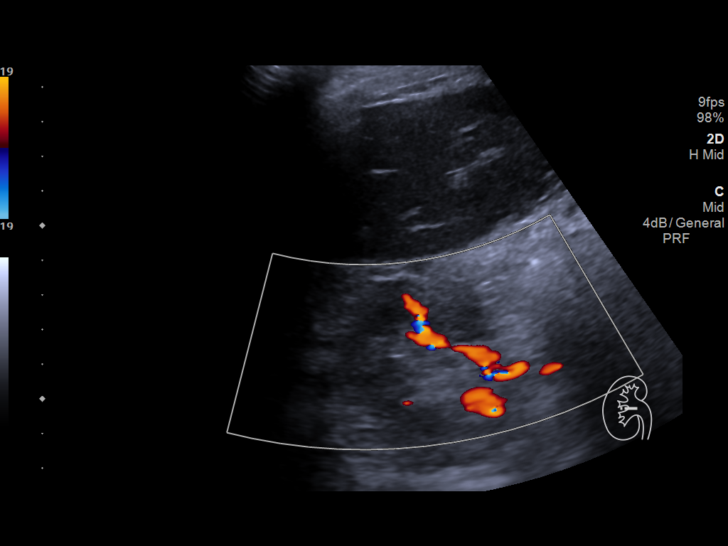
[im 60/72]
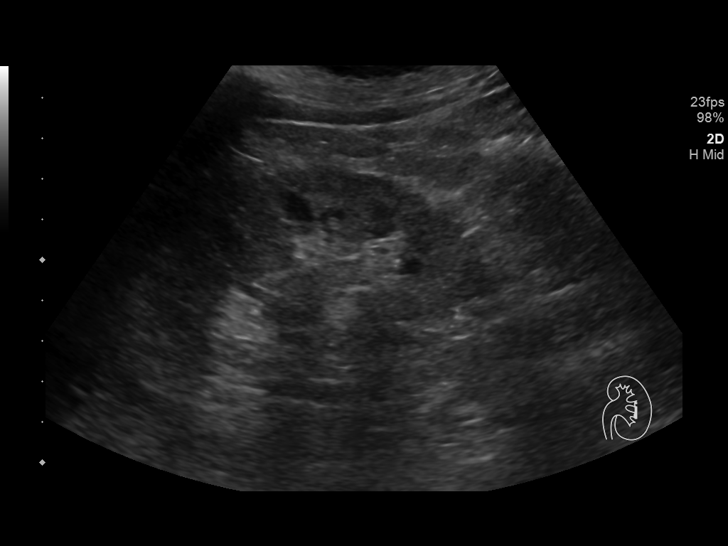
[im 66/72]
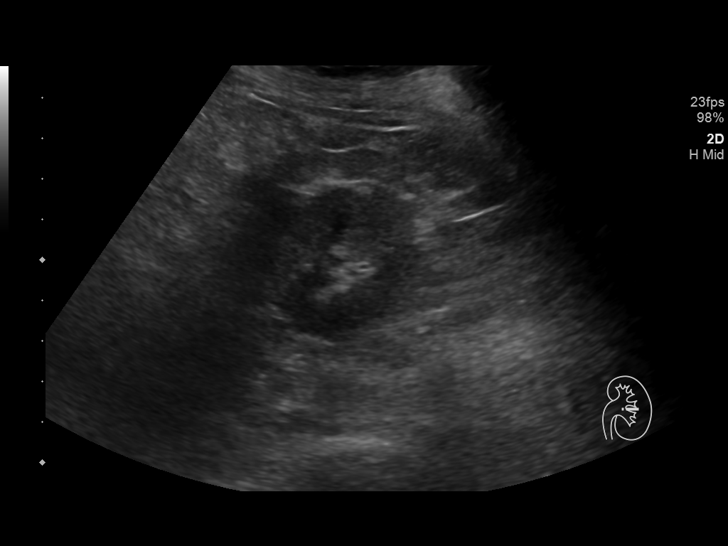
[im 72/72]
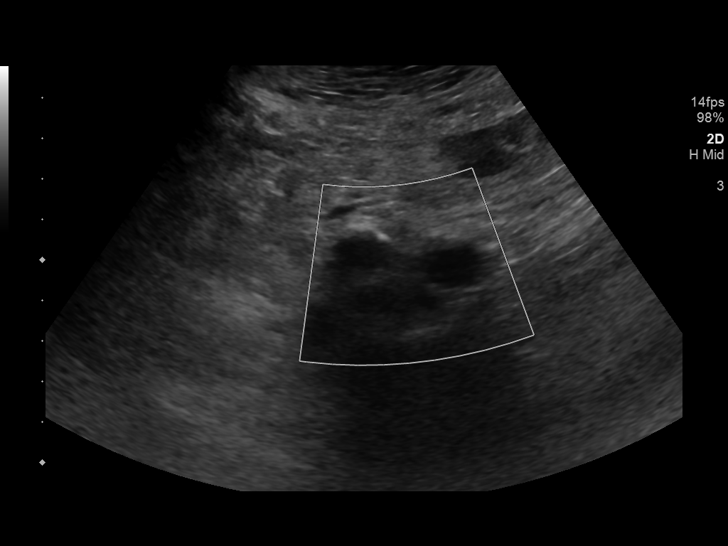

[13 of 25 positions shown; findings below may reference images not displayed]

FINDINGS: Gallbladder: There is a 1.7 cm calcified shadowing gallstone in the
contracted gallbladder. Apparent mild diffuse gallbladder wall
thickening. No pericholecystic fluid. No sonographic Murphy's sign.

Common bile duct: Diameter: 8 mm, mildly dilated. No
choledocholithiasis demonstrated, noting nonvisualization of the
distal common bile duct due to overlying bowel gas.

Liver: Liver parenchyma is mildly heterogeneous, with normal
echogenicity. No liver mass. Portal vein is patent on color Doppler
imaging with normal direction of blood flow towards the liver.

IVC: No abnormality visualized.

Pancreas: Visualized portion unremarkable, with top-normal main
pancreatic duct.

Spleen: Size and appearance within normal limits.

Right Kidney: Length: 9.7 cm. Echogenic right renal parenchyma. No
right hydronephrosis. No right renal mass.

Left Kidney: Length: 10.1 cm. Echogenic left renal parenchyma. No
left hydronephrosis. No left renal mass.

Abdominal aorta: Atherosclerotic abdominal aorta with ectatic 2.7 cm
infrarenal abdominal aorta.

Other findings: Small volume upper abdominal ascites.
IMPRESSION: 1. Cholelithiasis. Contracted gallbladder. Apparent mild diffuse
gallbladder wall thickening. No pericholecystic fluid. No
sonographic Murphy's sign. Sonographic findings are most compatible
with chronic calculous cholecystitis.
2. Mildly dilated common bile duct (8 mm diameter). No
choledocholithiasis demonstrated, noting nonvisualization of the
distal CBD due to overlying bowel gas. Recommend correlation with
serum bilirubin levels. Consider MRI abdomen with MRCP without and
with IV contrast for further evaluation, as clinically warranted.
3. Nonspecific mild liver parenchymal heterogeneity, cannot exclude
hepatocellular disease. No liver mass.
4. Echogenic kidneys, indicative of nonspecific renal parenchymal
disease of uncertain chronicity. No hydronephrosis.
5. Ectatic 2.7 cm infrarenal abdominal aorta, at risk for aneurysm
development. Recommend follow-up aortic ultrasound in 5 years. This
recommendation follows ACR consensus guidelines: White Paper of the
ACR Incidental Findings Committee II on Vascular Findings. [HOSPITAL] [id number]; [DATE].

## 2020-05-22 IMAGING — CR DG HAND COMPLETE 3+V*R*
4 series · 4 of 4 positions shown · non-contrast
Comparison: None.

CLINICAL DATA: Hand pain and weakness, no known injury, initial
encounter

EXAM:
RIGHT HAND - COMPLETE 3+ VIEW

[x hand pa right]
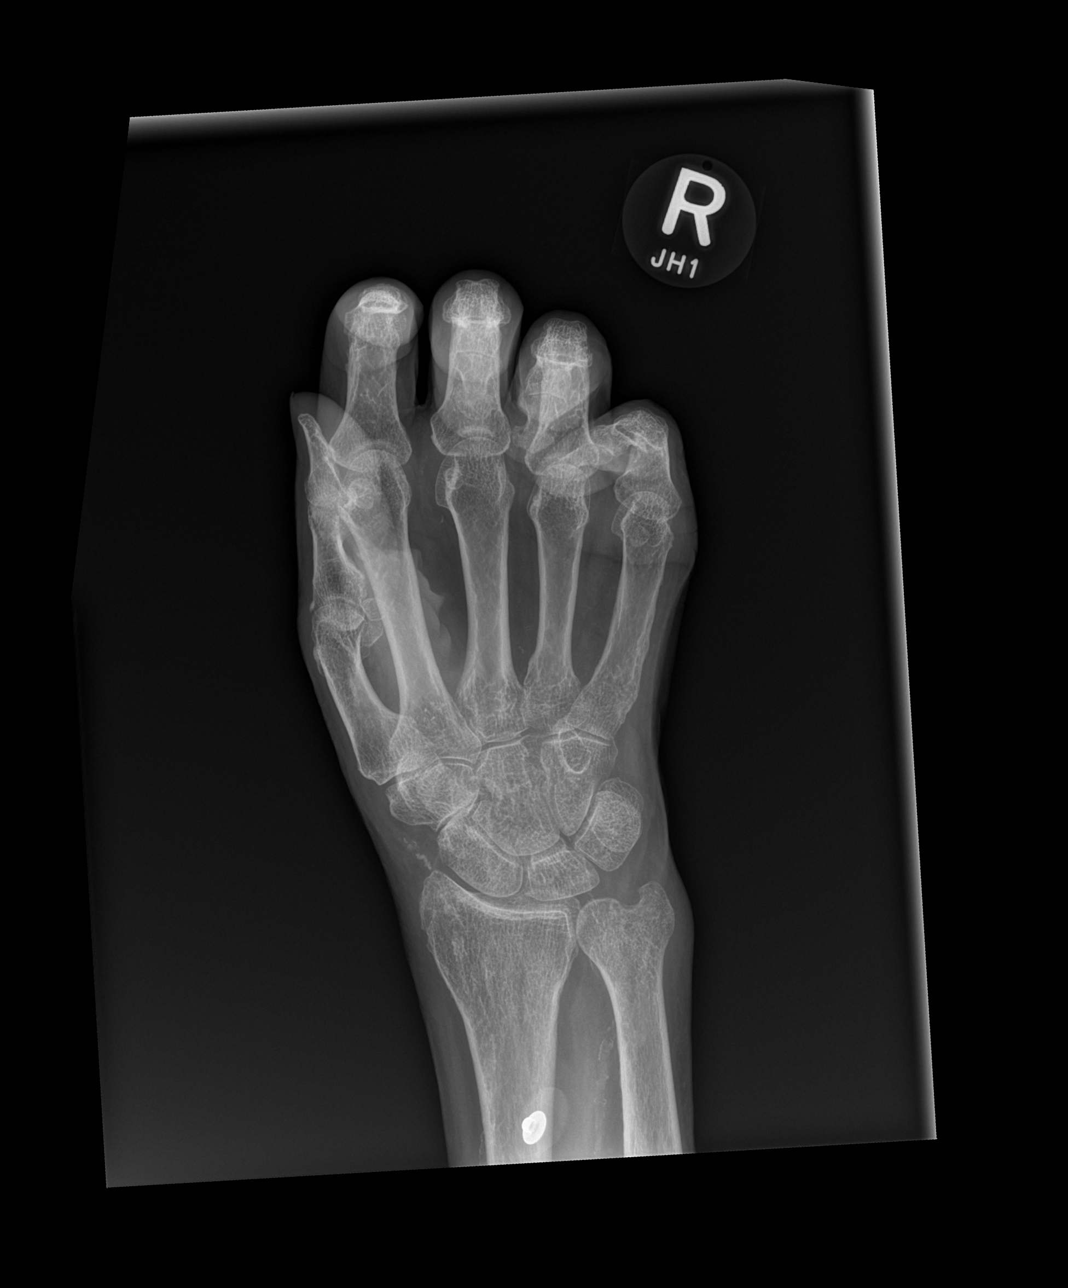

[x hand obl right]
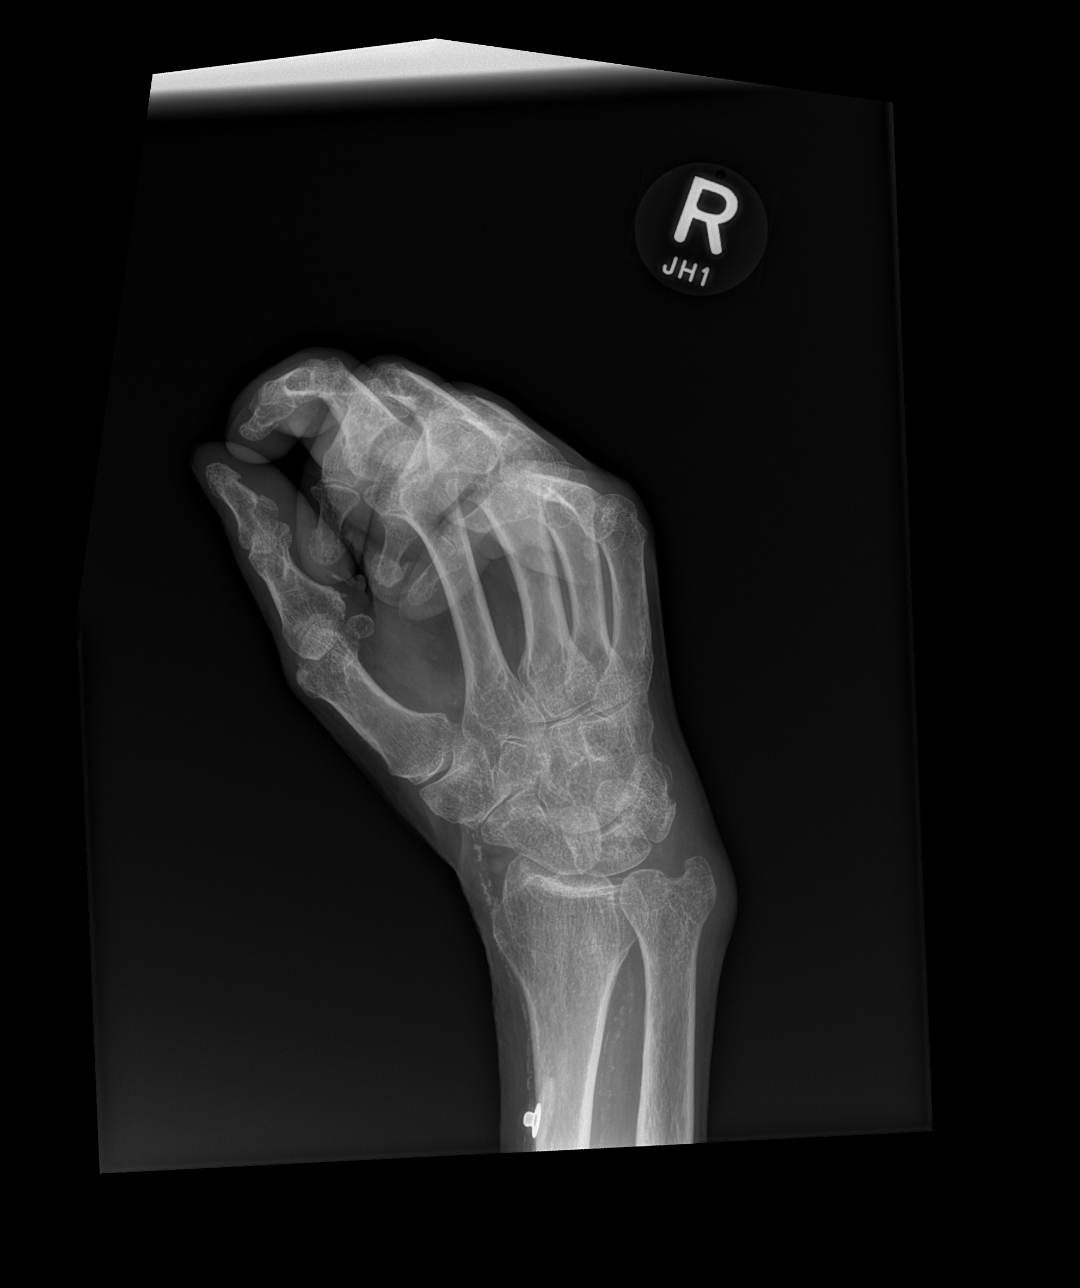

[x hand lat right (1 of 2)]
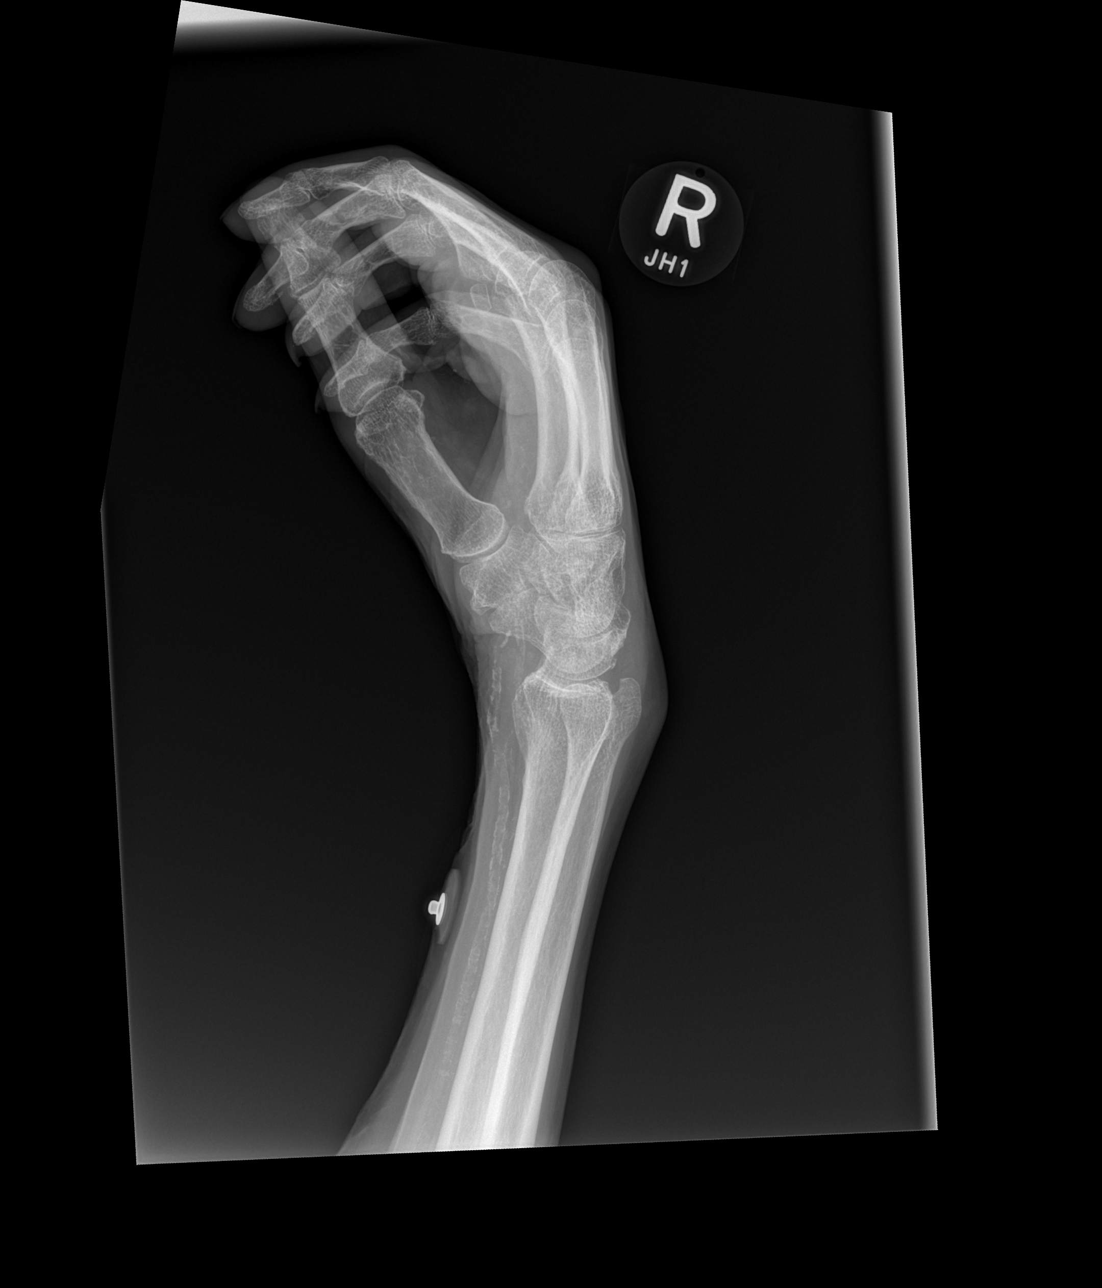

[x hand lat right (2 of 2)]
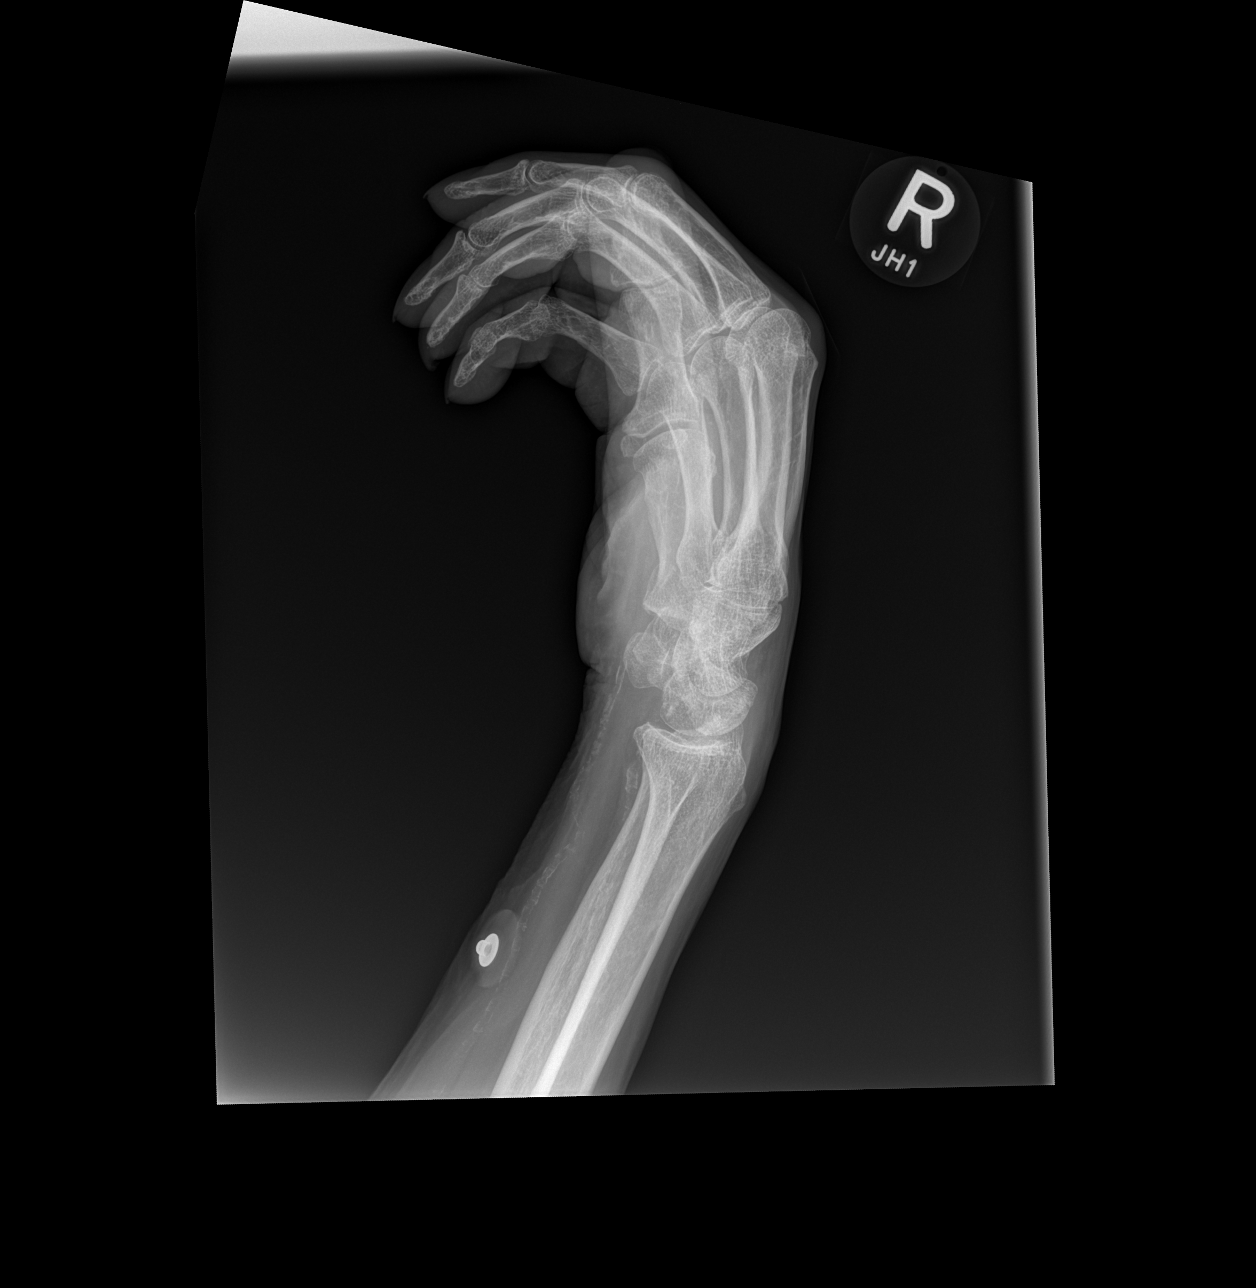

[4 of 4 positions shown; findings below may reference images not displayed]

FINDINGS: Examination is somewhat limited secondary to patient's inability to
straighten is fingers. Mild osteopenia is seen. No definitive acute
fracture is seen. Diffuse vascular calcifications are noted.
IMPRESSION: No acute abnormality noted.

## 2020-05-26 IMAGING — RF DG ERCP WO/W SPHINCTEROTOMY
1 series · 1 of 1 positions shown · non-contrast
Comparison: None.

CLINICAL DATA: Elevated liver function tests

EXAM:
ERCP
TECHNIQUE: Multiple spot images obtained with the fluoroscopic device and
submitted for interpretation post-procedure.
FLUOROSCOPY TIME:  Fluoroscopy Time:  2 minutes and 23 seconds
Radiation Exposure Index (if provided by the fluoroscopic device):
31.15 mGy
Number of Acquired Spot Images: 0

[Series 1: run · 1 of 1 slices shown]
[im 1/1]
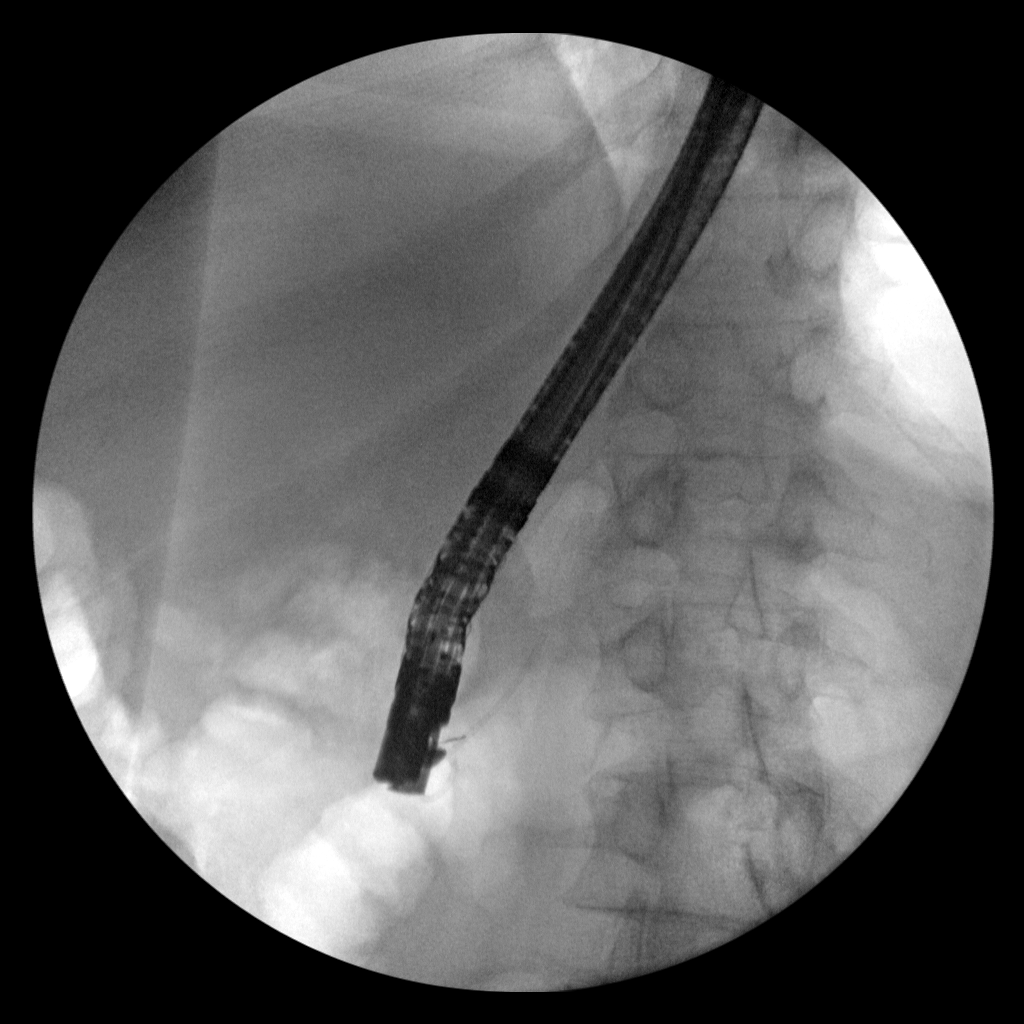

[1 of 1 positions shown; findings below may reference images not displayed]

FINDINGS: The single spot image demonstrates the endoscope in the descending
duodenum.
IMPRESSION: See above.

These images were submitted for radiologic interpretation only.
Please see the procedural report for the amount of contrast and the
fluoroscopy time utilized.
